# Patient Record
Sex: Male | Born: 1965
Health system: Southern US, Community
[De-identification: ages and names within clinical notes are randomized; demographics above are authoritative.]

## PROBLEM LIST (undated history)

## (undated) DIAGNOSIS — M542 Cervicalgia: Secondary | ICD-10-CM

## (undated) DIAGNOSIS — H9192 Unspecified hearing loss, left ear: Secondary | ICD-10-CM

## (undated) DIAGNOSIS — R030 Elevated blood-pressure reading, without diagnosis of hypertension: Secondary | ICD-10-CM

## (undated) DIAGNOSIS — R55 Syncope and collapse: Secondary | ICD-10-CM

## (undated) DIAGNOSIS — I10 Essential (primary) hypertension: Secondary | ICD-10-CM

## (undated) DIAGNOSIS — M5442 Lumbago with sciatica, left side: Secondary | ICD-10-CM

## (undated) DIAGNOSIS — H6092 Unspecified otitis externa, left ear: Secondary | ICD-10-CM

## (undated) HISTORY — DX: Lumbago with sciatica, left side: M54.42

## (undated) HISTORY — DX: Cervicalgia: M54.2

## (undated) HISTORY — DX: Unspecified otitis externa, left ear: H60.92

## (undated) HISTORY — DX: Unspecified hearing loss, left ear: H91.92

## (undated) HISTORY — PX: CERVICAL SPINE SURGERY: SHX589

## (undated) HISTORY — DX: Elevated blood-pressure reading, without diagnosis of hypertension: R03.0

## (undated) HISTORY — DX: Syncope and collapse: R55

---

## 2014-11-25 ENCOUNTER — Emergency Department
Admission: EM | Admit: 2014-11-25 | Discharge: 2014-11-25 | Disposition: A | Discharge status: Home / Self Care | Payer: Self-pay | Source: Home / Self Care | Attending: Family Medicine | Admitting: Family Medicine

## 2014-11-25 ENCOUNTER — Encounter: Payer: Self-pay | Admitting: *Deleted

## 2014-11-25 DIAGNOSIS — M5410 Radiculopathy, site unspecified: Secondary | ICD-10-CM

## 2014-11-25 DIAGNOSIS — L905 Scar conditions and fibrosis of skin: Secondary | ICD-10-CM

## 2014-11-25 MED ORDER — LIDOCAINE 5 % EX PTCH: MEDICATED_PATCH | CUTANEOUS | Status: DC

## 2014-11-25 MED ORDER — PREDNISONE 20 MG PO TABS: 20.0000 mg | ORAL_TABLET | Freq: Two times a day (BID) | ORAL | Status: DC

## 2014-11-25 NOTE — ED Notes (Signed)
Pt c/o LT upper leg pain x 1 day in the area where he had a wound 4-5 years ago. Denies fever.

## 2014-11-25 NOTE — ED Provider Notes (Signed)
CSN: [id number]     Arrival date & time 11/25/14  1902 History   First MD Initiated Contact with Patient 11/25/14 1922     Chief Complaint  Patient presents with  . Leg Pain      HPI Comments: Patient presents with two complaints: 1)  He has a painful scar on his left hip area as the result of an injury about 4 to 5 years ago.  The scar burns and stings, and exacerbates his left radicular back pain.  No recent erythema or warmth.  He has had good results in past with a lidocaine patch. 2)  He has a history of chronic left low back pain which flares up at times, radiating into his left hip and thigh.   He denies bowel or bladder dysfunction, and no saddle numbness.  He denies recent injury.    Patient is a 49 y.o. male presenting with back pain. The history is provided by the patient.  Back Pain Location:  Lumbar spine Quality:  Aching Radiates to:  L thigh Pain severity:  Mild Pain is:  Same all the time Onset quality:  Gradual Timing:  Constant Progression:  Worsening Chronicity:  Chronic Context: lifting heavy objects   Context: not recent injury   Relieved by:  Nothing Worsened by:  Stress Ineffective treatments:  None tried Associated symptoms: leg pain and weakness   Associated symptoms: no abdominal pain, no bladder incontinence, no bowel incontinence, no chest pain, no dysuria, no fever, no numbness, no paresthesias, no pelvic pain, no perianal numbness, no tingling and no weight loss   Risk factors: lack of exercise and obesity     History reviewed. No pertinent past medical history. Past Surgical History  Procedure Laterality Date  . Cervical spine surgery     Family History  Problem Relation Age of Onset  . Cancer Mother     intestinal  . Cancer Father     unknown type   Social History  Substance Use Topics  . Smoking status: Never Smoker   . Smokeless tobacco: None  . Alcohol Use: No    Review of Systems  Constitutional: Negative for fever and weight  loss.  Cardiovascular: Negative for chest pain.  Gastrointestinal: Negative for abdominal pain and bowel incontinence.  Genitourinary: Negative for bladder incontinence, dysuria and pelvic pain.  Musculoskeletal: Positive for back pain.  Skin: Positive for wound.  Neurological: Positive for weakness. Negative for tingling, numbness and paresthesias.    Allergies  Review of patient's allergies indicates no known allergies.  Home Medications   Prior to Admission medications   Medication Sig Start Date End Date Taking? Authorizing Provider  lidocaine (LIDODERM) 5 % Place 1/2 patch onto affected area at bedtime.  Remove & Discard patch within 12 hours 11/25/14   Lattie Haw, MD  predniSONE (DELTASONE) 20 MG tablet Take 1 tablet (20 mg total) by mouth 2 (two) times daily. Take with food. 11/25/14   Lattie Haw, MD   Meds Ordered and Administered this Visit  Medications - No data to display  BP 143/91 mmHg  Pulse 69  Temp(Src) 99 F (37.2 C) (Oral)  Resp 18  Ht  (1.651 m)  Wt 219 lb (99.338 kg)  BMI 36.44 kg/m2  SpO2 98% No data found.   Physical Exam  Constitutional: He is oriented to person, place, and time. He appears well-developed and well-nourished. No distress.  Patient is obese (BMI 36.4)  HENT:  Head: Normocephalic.  Nose: Nose  normal.  Mouth/Throat: Oropharynx is clear and moist.  Eyes: Conjunctivae are normal. Pupils are equal, round, and reactive to light.  Neck: Normal range of motion.  Cardiovascular: Normal heart sounds.   Pulmonary/Chest: Breath sounds normal.  Abdominal: There is no tenderness.  Musculoskeletal:       Lumbar back: He exhibits decreased range of motion, tenderness and pain. He exhibits no bony tenderness and no swelling.       Back:  Back:  Decreased range of motion. Tenderness in the left lumbosacral paraspinous area.  Straight leg raising test is positive on the left.  Sitting knee extension test is also positive on the left.   Strength and sensation in the lower extremities is intact.  Patellar and achilles reflexes are normal   Neurological: He is alert and oriented to person, place, and time.  Skin: Skin is warm and dry. He is not diaphoretic.     There is a well-healed superficial scar about 7cm diameter left hip area as noted on diagram.  The scar is mildly tender to palpation without erythema, warmth, or induration    Nursing note and vitals reviewed.   ED Course  Procedures  none  MDM   1. Radicular low back pain   2. Scar of hip    Begin prednisone burst.  Rx for lidoderm patch:  Apply 1/2 patch to scar at bedtime and leave on for 12 hours. Continue to apply ice pack to lower back for 20 to 30 minutes, 3 to 4 times daily  Continue until pain decreases.  Avoid heavy lifting. Followup with Dr. Rodney Langton or Dr. Clementeen [facility] ([facility]) in one week.    Lattie Haw, MD 11/27/14 1022

## 2014-11-25 NOTE — Discharge Instructions (Signed)
Continue to apply ice pack to lower back for 20 to 30 minutes, 3 to 4 times daily  Continue until pain decreases.  Avoid heavy lifting.   Lumbosacral Radiculopathy Lumbosacral radiculopathy is a pinched nerve or nerves in the low back (lumbosacral area). When this happens you may have weakness in your legs and may not be able to stand on your toes. You may have pain going down into your legs. There may be difficulties with walking normally. There are many causes of this problem. Sometimes this may happen from an injury, or simply from arthritis or boney problems. It may also be caused by other illnesses such as diabetes. If there is no improvement after treatment, further studies may be done to find the exact cause. DIAGNOSIS  X-rays may be needed if the problems become long standing. Electromyograms may be done. This study is one in which the working of nerves and muscles is studied. HOME CARE INSTRUCTIONS   Applications of ice packs may be helpful. Ice can be used in a plastic bag with a towel around it to prevent frostbite to skin. This may be used every 2 hours for 20 to 30 minutes, or as needed, while awake, or as directed by your caregiver.  Only take over-the-counter or prescription medicines for pain, discomfort, or fever as directed by your caregiver.  If physical therapy was prescribed, follow your caregiver's directions. SEEK IMMEDIATE MEDICAL CARE IF:   You have pain not controlled with medications.  You seem to be getting worse rather than better.  You develop increasing weakness in your legs.  You develop loss of bowel or bladder control.  You have difficulty with walking or balance, or develop clumsiness in the use of your legs.  You have a fever. MAKE SURE YOU:   Understand these instructions.  Will watch your condition.  Will get help right away if you are not doing well or get worse. Document Released: 03/15/2005 Document Revised: 06/07/2011 Document Reviewed:  11/03/2007 [facility] Patient Information 2015 [place], [place]. This information is not intended to replace advice given to you by your health care provider. Make sure you discuss any questions you have with your health care provider.

## 2014-11-27 ENCOUNTER — Telehealth: Payer: Self-pay | Admitting: *Deleted

## 2014-11-27 NOTE — ED Notes (Unsigned)
Trevor Jones came by the clinic states that he is no better, he did not receive an rx from Dr Cathren Harsh and needs another work note to excuse him until 12/03/14. I explained to him that he was given his rx at discharge to take to the pharmacy to fill. He states that he didn't have it, told him I could call it in for him.  I reprinted his original work note since he did not have it either. Advised him per [name] we will not extend his work note.He needs to f/u with PCP/sports med as he was advised by [name] at his previous office visit. He states that he will go to [facility] to get another work note and did not want me to call in the meds from [name] will get new rx from [facility]. Advised him again to call Dr [name] in the morning to establish and f/u from his office visit. Clemens Catholic, LPN

## 2014-12-03 ENCOUNTER — Emergency Department (HOSPITAL_COMMUNITY)
Admission: EM | Admit: 2014-12-03 | Discharge: 2014-12-03 | Disposition: A | Discharge status: Home / Self Care | Payer: Self-pay | Attending: Emergency Medicine | Admitting: Emergency Medicine

## 2014-12-03 ENCOUNTER — Encounter (HOSPITAL_COMMUNITY): Payer: Self-pay | Admitting: Emergency Medicine

## 2014-12-03 ENCOUNTER — Emergency Department (HOSPITAL_COMMUNITY): Payer: Self-pay

## 2014-12-03 DIAGNOSIS — S24109A Unspecified injury at unspecified level of thoracic spinal cord, initial encounter: Secondary | ICD-10-CM | POA: Insufficient documentation

## 2014-12-03 DIAGNOSIS — Y9389 Activity, other specified: Secondary | ICD-10-CM | POA: Insufficient documentation

## 2014-12-03 DIAGNOSIS — W19XXXA Unspecified fall, initial encounter: Secondary | ICD-10-CM

## 2014-12-03 DIAGNOSIS — S199XXA Unspecified injury of neck, initial encounter: Secondary | ICD-10-CM | POA: Insufficient documentation

## 2014-12-03 DIAGNOSIS — Y998 Other external cause status: Secondary | ICD-10-CM | POA: Insufficient documentation

## 2014-12-03 DIAGNOSIS — M5432 Sciatica, left side: Secondary | ICD-10-CM | POA: Insufficient documentation

## 2014-12-03 DIAGNOSIS — W1839XA Other fall on same level, initial encounter: Secondary | ICD-10-CM | POA: Insufficient documentation

## 2014-12-03 DIAGNOSIS — I10 Essential (primary) hypertension: Secondary | ICD-10-CM | POA: Insufficient documentation

## 2014-12-03 DIAGNOSIS — Y9289 Other specified places as the place of occurrence of the external cause: Secondary | ICD-10-CM | POA: Insufficient documentation

## 2014-12-03 HISTORY — DX: Essential (primary) hypertension: I10

## 2014-12-03 LAB — BASIC METABOLIC PANEL
Anion gap: 12 (ref 5–15)
BUN: 14 mg/dL (ref 6–20)
CO2: 23 mmol/L (ref 22–32)
Calcium: 8.9 mg/dL (ref 8.9–10.3)
Chloride: 98 mmol/L — ABNORMAL LOW (ref 101–111)
Creatinine, Ser: 0.87 mg/dL (ref 0.61–1.24)
GFR calc Af Amer: >60 mL/min (ref >60)
GFR calc non Af Amer: >60 mL/min (ref >60)
Glucose, Bld: 108 mg/dL — ABNORMAL HIGH (ref 65–99)
Potassium: 4.3 mmol/L (ref 3.5–5.1)
Sodium: 133 mmol/L — ABNORMAL LOW (ref 135–145)

## 2014-12-03 LAB — URINALYSIS, ROUTINE W REFLEX MICROSCOPIC
Bilirubin Urine: NEGATIVE
Glucose, UA: NEGATIVE mg/dL
Hgb urine dipstick: NEGATIVE
Ketones, ur: NEGATIVE mg/dL
Leukocytes, UA: NEGATIVE
Nitrite: NEGATIVE
Protein, ur: NEGATIVE mg/dL
Specific Gravity, Urine: 1.02 (ref 1.005–1.030)
Urobilinogen, UA: 1 mg/dL (ref 0.0–1.0)
pH: 7 (ref 5.0–8.0)

## 2014-12-03 LAB — CBC
HCT: 44.7 % (ref 39.0–52.0)
Hemoglobin: 15.3 g/dL (ref 13.0–17.0)
MCH: 33 pg (ref 26.0–34.0)
MCHC: 34.2 g/dL (ref 30.0–36.0)
MCV: 96.3 fL (ref 78.0–100.0)
Platelets: 130 10*3/uL — ABNORMAL LOW (ref 150–400)
RBC: 4.64 MIL/uL (ref 4.22–5.81)
RDW: 13.1 % (ref 11.5–15.5)
WBC: 6.3 10*3/uL (ref 4.0–10.5)

## 2014-12-03 LAB — TROPONIN I: Troponin I: <0.03 ng/mL (ref <0.031)

## 2014-12-03 LAB — CBG MONITORING, ED: Glucose-Capillary: 101 mg/dL — ABNORMAL HIGH (ref 65–99)

## 2014-12-03 MED ORDER — CYCLOBENZAPRINE HCL 10 MG PO TABS
5.0000 mg | ORAL_TABLET | Freq: Once | ORAL | Status: AC
  Administered 2014-12-03: 5 mg via ORAL
  Filled 2014-12-03: qty 1

## 2014-12-03 MED ORDER — KETOROLAC TROMETHAMINE 30 MG/ML IJ SOLN
30.0000 mg | Freq: Once | INTRAMUSCULAR | Status: AC
  Administered 2014-12-03: 30 mg via INTRAVENOUS
  Filled 2014-12-03: qty 1

## 2014-12-03 NOTE — ED Notes (Addendum)
Pt was found on the ground by his wife after falling-was on the way to drop his wife off for work, walked around the front of the car and fell but caught himself with his arms so he did not hit his head (per patient). A&Ox4 when found. Had been off work recently for back and shoulder pain-diagnosed with pinched nerve. Was supposed to go back to work today. Having some neck pain and now having left buttock and left lower back pain. Has had "equilibrium problems" in the past. Endorses dizziness before falling and says, "I just didn't feel good this morning." Speaking full/clear sentences. RR even/unlabored. C-collar applied and aligned. Placed on LSB. Able to answer all questions appropriately. Has not eaten anything since last night. No hx DM. No other c/c. No abrasions or lacerations noted anywhere on body.

## 2014-12-03 NOTE — Discharge Instructions (Signed)
Take the medication give to you last week. Back Pain, Adult Back pain is very common. The pain often gets better over time. The cause of back pain is usually not dangerous. Most people can learn to manage their back pain on their own.  HOME CARE   Stay active. Start with short walks on flat ground if you can. Try to walk farther each day.  Do not sit, drive, or stand in one place for more than 30 minutes. Do not stay in bed.  Do not avoid exercise or work. Activity can help your back heal faster.  Be careful when you bend or lift an object. Bend at your knees, keep the object close to you, and do not twist.  Sleep on a firm mattress. Lie on your side, and bend your knees. If you lie on your back, put a pillow under your knees.  Only take medicines as told by your doctor.  Put ice on the injured area.  Put ice in a plastic bag.  Place a towel between your skin and the bag.  Leave the ice on for 15-20 minutes, 03-04 times a day for the first 2 to 3 days. After that, you can switch between ice and heat packs.  Ask your doctor about back exercises or massage.  Avoid feeling anxious or stressed. Find good ways to deal with stress, such as exercise. GET HELP RIGHT AWAY IF:   Your pain does not go away with rest or medicine.  Your pain does not go away in 1 week.  You have new problems.  You do not feel well.  The pain spreads into your legs.  You cannot control when you poop (bowel movement) or pee (urinate).  Your arms or legs feel weak or lose feeling (numbness).  You feel sick to your stomach (nauseous) or throw up (vomit).  You have belly (abdominal) pain.  You feel like you may pass out (faint). MAKE SURE YOU:   Understand these instructions.  Will watch your condition.  Will get help right away if you are not doing well or get worse. Document Released: 09/01/2007 Document Revised: 06/07/2011 Document Reviewed: 07/17/2013 [facility] Patient Information 2015  [place], [place]. This information is not intended to replace advice given to you by your health care provider. Make sure you discuss any questions you have with your health care provider.

## 2014-12-03 NOTE — ED Provider Notes (Signed)
CSN: [id number]     Arrival date & time 12/03/14  0608 History   First MD Initiated Contact with Patient 12/03/14 [phone]     Chief Complaint  Patient presents with  . Fall  . Fatigue  . Back Pain     (Consider location/radiation/quality/duration/timing/severity/associated sxs/prior Treatment) HPI Comments: Pt comes in with c/o ongoing left lower back pain that radiates down he left leg. Denies numbness or weakness. States that he was seen for it last week at urgent care and was given medications but he hasn't taken any because he can't  Afford them. He states that he wasn't feeling very good this morning and when he was bringing his wife to work this morning he fell. He states that he is doesn't think that he had loc and he states that he knows he didn't hit his head. He states that he has a history of equilibrium problems like this in the past. He denies cp or sob. He states that he is having neck pain and upper back pain since falling.  The history is provided by the patient and the spouse. No language interpreter was used.    Past Medical History  Diagnosis Date  . Hypertension    Past Surgical History  Procedure Laterality Date  . Cervical spine surgery     Family History  Problem Relation Age of Onset  . Cancer Mother     intestinal  . Cancer Father     unknown type   Social History  Substance Use Topics  . Smoking status: Never Smoker   . Smokeless tobacco: None  . Alcohol Use: No    Review of Systems  All other systems reviewed and are negative.     Allergies  Review of patient's allergies indicates no known allergies.  Home Medications   Prior to Admission medications   Medication Sig Start Date End Date Taking? Authorizing Provider  ibuprofen (ADVIL,MOTRIN) 200 MG tablet Take 800 mg by mouth every 6 (six) hours as needed for moderate pain.   Yes Historical Provider, MD  lidocaine (LIDODERM) 5 % Place 1/2 patch onto affected area at bedtime.  Remove & Discard  patch within 12 hours Patient not taking: Reported on 12/03/2014 11/25/14   Lattie Haw, MD  predniSONE (DELTASONE) 20 MG tablet Take 1 tablet (20 mg total) by mouth 2 (two) times daily. Take with food. Patient not taking: Reported on 12/03/2014 11/25/14   Lattie Haw, MD   BP 118/85 mmHg  Pulse 63  Temp(Src) 98.3 F (36.8 C) (Oral)  Resp 13  Ht  (1.651 m)  Wt 217 lb (98.431 kg)  BMI 36.11 kg/m2  SpO2 99% Physical Exam  Constitutional: He is oriented to person, place, and time. He appears well-developed and well-nourished.  HENT:  Head: Normocephalic and atraumatic.  Right Ear: External ear normal.  Cardiovascular: Normal rate and regular rhythm.   Pulmonary/Chest: Effort normal.  Abdominal: Soft. Bowel sounds are normal. There is no tenderness.  Musculoskeletal:       Cervical back: He exhibits bony tenderness.       Thoracic back: He exhibits tenderness.       Lumbar back: He exhibits no bony tenderness.  Left sciatic notch tenderness. Full rom of motion and sensation to all extremities. No sign of abrasion or laceration  Neurological: He is alert and oriented to person, place, and time.  Skin: Skin is warm and dry.  Psychiatric: He has a normal mood and affect.  Nursing  note and vitals reviewed.   ED Course  Procedures (including critical care time) Labs Review Labs Reviewed  BASIC METABOLIC PANEL - Abnormal; Notable for the following:    Sodium 133 (*)    Chloride 98 (*)    Glucose, Bld 108 (*)    All other components within normal limits  CBC - Abnormal; Notable for the following:    Platelets 130 (*)    All other components within normal limits  CBG MONITORING, ED - Abnormal; Notable for the following:    Glucose-Capillary 101 (*)    All other components within normal limits  URINALYSIS, ROUTINE W REFLEX MICROSCOPIC (NOT AT [facility])  TROPONIN I  CBG MONITORING, ED    Imaging Review Dg Cervical Spine Complete  12/03/2014   CLINICAL DATA:  Pain following  fall earlier today. Numbness in fingers of left hand.  EXAM: CERVICAL SPINE  4+ VIEWS  COMPARISON:  None.  FINDINGS: Frontal, lateral, open-mouth odontoid, and bilateral oblique views were obtained with the patient in collar. The patient is status post anterior screw and plate fixation at C3 and C4 with the screw and plate fixation device appearing intact. There is a disc spacer at C3-4, intact. There is no demonstrable fracture or spondylolisthesis. Prevertebral soft tissues and predental space regions are within normal limits. There is fairly marked disc space narrowing at C5-6. There is moderate narrowing at C2-3 and C6-7. There is exit foraminal narrowing due to bony hypertrophy at C5-6 bilaterally and at C6-7 bilaterally, more severe on the left than on the right.  IMPRESSION: Postoperative change and multilevel osteoarthritic change. No fracture or spondylolisthesis appreciable. Note that no assessment for potential ligamentous injury can be made with in collar only images.   Electronically Signed   By: Bretta Bang III M.D.   On: 12/03/2014 07:46   Dg Thoracic Spine W/swimmers  12/03/2014   CLINICAL DATA:  Fall this morning with mid upper back pain. Initial encounter.  EXAM: THORACIC SPINE - 3 VIEWS  COMPARISON:  None.  FINDINGS: Subtle T2 and T3 superior endplate concavities in the lateral projection is attributed to obliquity. There is no definitive fracture. No subluxation. Spondylotic spurring noted at the thoracolumbar junction.  IMPRESSION: No subluxation or convincing fracture.   Electronically Signed   By: Marnee Spring M.D.   On: 12/03/2014 07:51   I have personally reviewed and evaluated these images and lab results as part of my medical decision-making.   EKG Interpretation   Date/Time:  Tuesday December 03 2014 06:12:42 EDT Ventricular Rate:  66 PR Interval:  156 QRS Duration: 76 QT Interval:  383 QTC Calculation: 401 R Axis:   83 Text Interpretation:  Sinus rhythm Probable  left atrial enlargement ST  elev, probable normal early repol pattern no reciprocal changes No old  tracing to compare Confirmed by GOLDSTON  MD, SCOTT ([id number]) on 12/03/2014  6:59:37 AM      MDM   Final diagnoses:  Fall, initial encounter  Sciatica, left    Doubt that pt had a syncopal episode. Think more likely a fall. Pt was given medications for sciatica last week that weren't filled.discussed with pt that he needs to have filled. Pt states that he needs a couple of weeks off work. Discussed with pt that we don't give that much time off from the er.    Teressa Lower, NP 12/03/14 1610  Pricilla Loveless, MD 12/04/14 [phone]

## 2014-12-16 ENCOUNTER — Encounter: Payer: Self-pay | Admitting: Family

## 2014-12-16 ENCOUNTER — Ambulatory Visit (INDEPENDENT_AMBULATORY_CARE_PROVIDER_SITE_OTHER): Payer: Self-pay | Admitting: Family

## 2014-12-16 VITALS — BP 138/88 | HR 78 | Temp 98.5°F | Resp 18 | Ht 65.0 in | Wt 214.0 lb

## 2014-12-16 DIAGNOSIS — M542 Cervicalgia: Secondary | ICD-10-CM

## 2014-12-16 DIAGNOSIS — R55 Syncope and collapse: Secondary | ICD-10-CM

## 2014-12-16 DIAGNOSIS — M5442 Lumbago with sciatica, left side: Secondary | ICD-10-CM

## 2014-12-16 HISTORY — DX: Lumbago with sciatica, left side: M54.42

## 2014-12-16 HISTORY — DX: Cervicalgia: M54.2

## 2014-12-16 HISTORY — DX: Syncope and collapse: R55

## 2014-12-16 MED ORDER — PREDNISONE 10 MG PO TABS: ORAL_TABLET | ORAL | Status: DC

## 2014-12-16 NOTE — Progress Notes (Signed)
Subjective:    Patient ID: Trevor Jones, male    DOB: [birth year], 49 y.o.   MRN: [id number]  Chief Complaint  Patient presents with  . Establish Care    States that he is having issues with pain in his left hip and down leg, also in arms and did have neck surgery a while back, has had issues with blacking out and has jury duty this week and wants to know if he can get a note to get out of it due to the passing out.     HPI:  Trevor Jones is a 49 y.o. male who  has a past medical history of Hypertension; Neck pain; and Decreased hearing of left ear. and presents today for an office visit to establish care.   1.) Back pain / hip pain - Associated symptom of pain located in his lower back and left hip that has been going on for about 1 year. Pain is described as throbbing with a severity of 8/10. Modifying treatments includes heat and ice which have helped with his pain. Previously seen in Urgent Care for similar concerns and was diagnosed with radicular low back pain and discharged with prednisone which he did not take because he indicates that he cannot afford it and there was concern it may make him drowsy. He is also requesting a note excusing him from upcoming jury duty.   2.) Blacking out - Associated symptoms of blacking out has been going on for a couple of years since his neck surgery. Unsure of frequency, most recently was seen in the ED about 2 weeks ago believing he blacked out which resulted in a fall, however per ED notes there is low belief for any syncope episode. Denies any workup for the black outs.    No Known Allergies   Outpatient Prescriptions Prior to Visit  Medication Sig Dispense Refill  . ibuprofen (ADVIL,MOTRIN) 200 MG tablet Take 800 mg by mouth every 6 (six) hours as needed for moderate pain.    Marland Kitchen lidocaine (LIDODERM) 5 % Place 1/2 patch onto affected area at bedtime.  Remove & Discard patch within 12 hours 6 patch 0  . predniSONE (DELTASONE) 20 MG  tablet Take 1 tablet (20 mg total) by mouth 2 (two) times daily. Take with food. 10 tablet 0   No facility-administered medications prior to visit.     Past Medical History  Diagnosis Date  . Hypertension   . Neck pain   . Decreased hearing of left ear      Past Surgical History  Procedure Laterality Date  . Cervical spine surgery       Family History  Problem Relation Age of Onset  . Cancer Mother     intestinal  . Cancer Father     unknown type     Social History   Social History  . Marital Status: Married    Spouse Name: N/A  . Number of Children: 0  . Years of Education: 14   Occupational History  . Scientist, physiological     Social History Main Topics  . Smoking status: Never Smoker   . Smokeless tobacco: Never Used  . Alcohol Use: No  . Drug Use: No  . Sexual Activity: Not on file   Other Topics Concern  . Not on file   Social History Narrative   Denies religious beliefs effecting health care.     Review of Systems  Constitutional: Negative for fever and chills.  Respiratory:  Negative for chest tightness and shortness of breath.   Cardiovascular: Negative for chest pain, palpitations and leg swelling.  Musculoskeletal: Positive for back pain, neck pain and neck stiffness.  Neurological: Positive for syncope.      Objective:    BP 138/88 mmHg  Pulse 78  Temp(Src) 98.5 F (36.9 C) (Oral)  Resp 18  Ht  (1.651 m)  Wt 214 lb (97.07 kg)  BMI 35.61 kg/m2  SpO2 97% Nursing note and vital signs reviewed.  Physical Exam  Constitutional: He is oriented to person, place, and time. He appears well-developed and well-nourished. No distress.  Neck:  No obvious deformity, discoloration, or edema of the neck noted. Tenderness noted along paraspinal musculature with no evident muscle spasm. Range of motion restricted in lateral bending bilaterally. Compression test is negative. Distal pulses, sensation, and reflexes are intact and appropriate.   Cardiovascular: Normal rate, regular rhythm, normal heart sounds and intact distal pulses.   Pulmonary/Chest: Effort normal and breath sounds normal.  Musculoskeletal:  Low back with no obvious deformity, discoloration, or edema. There is excessive lordotic curve secondary to obesity. Palpable tenderness of lumbar spine paraspinal musculature on the left and sciatic nerve distribution. Range of motion is limited secondary to tight musculature and discomfort. Distal pulses and sensation are intact and appropriate.  Neurological: He is alert and oriented to person, place, and time.  Skin: Skin is warm and dry.  Psychiatric: He has a normal mood and affect. His behavior is normal. Judgment and thought content normal.       Assessment & Plan:   Problem List Items Addressed This Visit      Cardiovascular and Mediastinum   Black-out (not amnesia)    Question episodes of blackout/syncope as described by patient. Events occur infrequently noting 4 times in the past 2 years. Previous EKG with mild ST elevation in the emergency room and normal sinus rhythm. Obtain 2-D echocardiogram. Follow up pending blood work.      Relevant Orders   Echocardiogram     Other   Cervical pain - Primary    Cervical pain most likely related to previous surgery and restricted range of motion. Continue conservative treatment with home exercise therapy, heat, and stretching. Refer to formalized physical therapy. May require additional imaging if no improvement is noted.      Relevant Medications   predniSONE (DELTASONE) 10 MG tablet   Other Relevant Orders   Ambulatory referral to Physical Therapy   AMB referral to orthopedics   Left-sided low back pain with left-sided sciatica    Left-sided lower back pain consistent with possible disc pathology and/or sciatic nerve root irritation. Start prednisone. Discussed risks and benefits of medication with patient and encouraged patient compliance. Continue home exercise  therapy with heat and ice. Refer to physical therapy for further evaluation and treatment. May require additional imaging and possible referral to orthopedics for injections. Continue work as tolerated.      Relevant Medications   predniSONE (DELTASONE) 10 MG tablet   Other Relevant Orders   Ambulatory referral to Physical Therapy   AMB referral to orthopedics

## 2014-12-16 NOTE — Progress Notes (Signed)
Pre visit review using our clinic review tool, if applicable. No additional management support is needed unless otherwise documented below in the visit note. 

## 2014-12-16 NOTE — Patient Instructions (Signed)
Thank you for choosing [facility].  Summary/Instructions:  6 Day Prednisone Taper Instructions:   Day 1: Two tablets before breakfast, one after lunch, one after dinner, and two at bedtime.  Day 2: One tablet before breakfast, one after lunch, one after dinner, and two at bedtime Day 3: One tablet before breakfast, one after lunch, one after dinner, and one at bedtime Day 4: One tablet before breakfast, one after lunch, and one at bedtime Day 5: One tablet before breakfast and one at bedtime Day 6: One tablet before breakfast   Your prescription(s) have been submitted to your pharmacy or been printed and provided for you. Please take as directed and contact our office if you believe you are having problem(s) with the medication(s) or have any questions.  Referrals have been made during this visit. You should expect to hear back from our schedulers in about 7-10 days in regards to establishing an appointment with the specialists we discussed.   If your symptoms worsen or fail to improve, please contact our office for further instruction, or in case of emergency go directly to the emergency room at the closest medical facility.   The goal of pain medication is to ease the pain, but it will not get rid of it completely. Do not take more pain medicine than needed.   Do NOT drive while taking narcotic pain medications.   Do NOT drink alcohol while taking prescription pain medication.   Please use over-the-counter medications for constipation, such as miralax, senakot, colace, or magnesium citrate, while you are taking narcotic pain medication.  Pay attention to the side effects of any pain medication  Do NOT exceed 3000 mg of acetaminophen (Tylenol) TOTAL in a 24 hour period. (Please be aware that you may have acetaminophen in other medications that you are taking.)   EXERCISES RANGE OF MOTION (ROM) AND STRETCHING EXERCISES - Cervical Strain and Sprain These exercises may help  you when beginning to rehabilitate your injury. In order to successfully resolve your symptoms, you must improve your posture. These exercises are designed to help reduce the forward-head and rounded-shoulder posture which contributes to this condition. Your symptoms may resolve with or without further involvement from your physician, physical therapist or athletic trainer. While completing these exercises, remember:   Restoring tissue flexibility helps normal motion to return to the joints. This allows healthier, less painful movement and activity.  An effective stretch should be held for at least 20 seconds, although you may need to begin with shorter hold times for comfort.  A stretch should never be painful. You should only feel a gentle lengthening or release in the stretched tissue. STRETCH- Axial Extensors  Lie on your back on the floor. You may bend your knees for comfort. Place a rolled-up hand towel or dish towel, about 2 inches in diameter, under the part of your head that makes contact with the floor.  Gently tuck your chin, as if trying to make a "double chin," until you feel a gentle stretch at the base of your head.  Hold __________ seconds. Repeat __________ times. Complete this exercise __________ times per day.  STRETCH - Axial Extension   Stand or sit on a firm surface. Assume a good posture: chest up, shoulders drawn back, abdominal muscles slightly tense, knees unlocked (if standing) and feet hip width apart.  Slowly retract your chin so your head slides back and your chin slightly lowers. Continue to look straight ahead.  You should feel a gentle stretch  in the back of your head. Be certain not to feel an aggressive stretch since this can cause headaches later.  Hold for __________ seconds. Repeat __________ times. Complete this exercise __________ times per day. STRETCH - Cervical Side Bend   Stand or sit on a firm surface. Assume a good posture: chest up, shoulders  drawn back, abdominal muscles slightly tense, knees unlocked (if standing) and feet hip width apart.  Without letting your nose or shoulders move, slowly tip your right / left ear to your shoulder until your feel a gentle stretch in the muscles on the opposite side of your neck.  Hold __________ seconds. Repeat __________ times. Complete this exercise __________ times per day. STRETCH - Cervical Rotators   Stand or sit on a firm surface. Assume a good posture: chest up, shoulders drawn back, abdominal muscles slightly tense, knees unlocked (if standing) and feet hip width apart.  Keeping your eyes level with the ground, slowly turn your head until you feel a gentle stretch along the back and opposite side of your neck.  Hold __________ seconds. Repeat __________ times. Complete this exercise __________ times per day. RANGE OF MOTION - Neck Circles   Stand or sit on a firm surface. Assume a good posture: chest up, shoulders drawn back, abdominal muscles slightly tense, knees unlocked (if standing) and feet hip width apart.  Gently roll your head down and around from the back of one shoulder to the back of the other. The motion should never be forced or painful.  Repeat the motion 10-20 times, or until you feel the neck muscles relax and loosen. Repeat __________ times. Complete the exercise __________ times per day. STRENGTHENING EXERCISES - Cervical Strain and Sprain These exercises may help you when beginning to rehabilitate your injury. They may resolve your symptoms with or without further involvement from your physician, physical therapist, or athletic trainer. While completing these exercises, remember:   Muscles can gain both the endurance and the strength needed for everyday activities through controlled exercises.  Complete these exercises as instructed by your physician, physical therapist, or athletic trainer. Progress the resistance and repetitions only as guided.  You may  experience muscle soreness or fatigue, but the pain or discomfort you are trying to eliminate should never worsen during these exercises. If this pain does worsen, stop and make certain you are following the directions exactly. If the pain is still present after adjustments, discontinue the exercise until you can discuss the trouble with your clinician. STRENGTH - Cervical Flexors, Isometric  Face a wall, standing about 6 inches away. Place a small pillow, a ball about 6-8 inches in diameter, or a folded towel between your forehead and the wall.  Slightly tuck your chin and gently push your forehead into the soft object. Push only with mild to moderate intensity, building up tension gradually. Keep your jaw and forehead relaxed.  Hold 10 to 20 seconds. Keep your breathing relaxed.  Release the tension slowly. Relax your neck muscles completely before you start the next repetition. Repeat __________ times. Complete this exercise __________ times per day. STRENGTH- Cervical Lateral Flexors, Isometric   Stand about 6 inches away from a wall. Place a small pillow, a ball about 6-8 inches in diameter, or a folded towel between the side of your head and the wall.  Slightly tuck your chin and gently tilt your head into the soft object. Push only with mild to moderate intensity, building up tension gradually. Keep your jaw and forehead relaxed.  Hold 10 to 20 seconds. Keep your breathing relaxed.  Release the tension slowly. Relax your neck muscles completely before you start the next repetition. Repeat __________ times. Complete this exercise __________ times per day. STRENGTH - Cervical Extensors, Isometric   Stand about 6 inches away from a wall. Place a small pillow, a ball about 6-8 inches in diameter, or a folded towel between the back of your head and the wall.  Slightly tuck your chin and gently tilt your head back into the soft object. Push only with mild to moderate intensity, building up  tension gradually. Keep your jaw and forehead relaxed.  Hold 10 to 20 seconds. Keep your breathing relaxed.  Release the tension slowly. Relax your neck muscles completely before you start the next repetition. Repeat __________ times. Complete this exercise __________ times per day. POSTURE AND BODY MECHANICS CONSIDERATIONS - Cervical Strain and Sprain Keeping correct posture when sitting, standing or completing your activities will reduce the stress put on different body tissues, allowing injured tissues a chance to heal and limiting painful experiences. The following are general guidelines for improved posture. Your physician or physical therapist will provide you with any instructions specific to your needs. While reading these guidelines, remember:  The exercises prescribed by your provider will help you have the flexibility and strength to maintain correct postures.  The correct posture provides the optimal environment for your joints to work. All of your joints have less wear and tear when properly supported by a spine with good posture. This means you will experience a healthier, less painful body.  Correct posture must be practiced with all of your activities, especially prolonged sitting and standing. Correct posture is as important when doing repetitive low-stress activities (typing) as it is when doing a single heavy-load activity (lifting). PROLONGED STANDING WHILE SLIGHTLY LEANING FORWARD When completing a task that requires you to lean forward while standing in one place for a long time, place either foot up on a stationary 2- to 4-inch high object to help maintain the best posture. When both feet are on the ground, the low back tends to lose its slight inward curve. If this curve flattens (or becomes too large), then the back and your other joints will experience too much stress, fatigue more quickly, and can cause pain.  RESTING POSITIONS Consider which positions are most painful for  you when choosing a resting position. If you have pain with flexion-based activities (sitting, bending, stooping, squatting), choose a position that allows you to rest in a less flexed posture. You would want to avoid curling into a fetal position on your side. If your pain worsens with extension-based activities (prolonged standing, working overhead), avoid resting in an extended position such as sleeping on your stomach. Most people will find more comfort when they rest with their spine in a more neutral position, neither too rounded nor too arched. Lying on a non-sagging bed on your side with a pillow between your knees, or on your back with a pillow under your knees will often provide some relief. Keep in mind, being in any one position for a prolonged period of time, no matter how correct your posture, can still lead to stiffness. WALKING Walk with an upright posture. Your ears, shoulders, and hips should all line up. OFFICE WORK When working at a desk, create an environment that supports good, upright posture. Without extra support, muscles fatigue and lead to excessive strain on joints and other tissues. CHAIR:  A chair should be  able to slide under your desk when your back makes contact with the back of the chair. This allows you to work closely.  The chair's height should allow your eyes to be level with the upper part of your monitor and your hands to be slightly lower than your elbows.  Body position:  Your feet should make contact with the floor. If this is not possible, use a foot rest.  Keep your ears over your shoulders. This will reduce stress on your neck and low back. Document Released: 03/15/2005 Document Revised: 07/30/2013 Document Reviewed: 06/27/2008 [facility] Patient Information 2015 [place], [place]. This information is not intended to replace advice given to you by your health care provider. Make sure you discuss any questions you have with your health care provider.  HEAT  AND COLD  Cold treatment (icing) should be applied for 10 to 15 minutes every 2 to 3 hours for inflammation and pain, and immediately after activity that aggravates your symptoms. Use ice packs or an ice massage.  Heat treatment may be used before performing stretching and strengthening activities prescribed by your caregiver, physical therapist, or athletic trainer. Use a heat pack or a warm water soak. SEEK MEDICAL CARE IF:   Symptoms get worse or do not improve in 2 to 4 weeks, despite treatment.  You develop numbness or weakness in either leg.  You lose bowel or bladder function.  Any of the following occur after surgery: fever, increased pain, swelling, redness, drainage of fluids, or bleeding in the affected area.  New, unexplained symptoms develop. (Drugs used in treatment may produce side effects.) EXERCISES  RANGE OF MOTION (ROM) AND STRETCHING EXERCISES - Low Back Sprain Most people with lower back pain will find that their symptoms get worse with excessive bending forward (flexion) or arching at the lower back (extension). The exercises that will help resolve your symptoms will focus on the opposite motion.  Your physician, physical therapist or athletic trainer will help you determine which exercises will be most helpful to resolve your lower back pain. Do not complete any exercises without first consulting with your caregiver. Discontinue any exercises which make your symptoms worse, until you speak to your caregiver. If you have pain, numbness or tingling which travels down into your buttocks, leg or foot, the goal of the therapy is for these symptoms to move closer to your back and eventually resolve. Sometimes, these leg symptoms will get better, but your lower back pain may worsen. This is often an indication of progress in your rehabilitation. Be very alert to any changes in your symptoms and the activities in which you participated in the 24 hours prior to the change. Sharing  this information with your caregiver will allow him or her to most efficiently treat your condition. These exercises may help you when beginning to rehabilitate your injury. Your symptoms may resolve with or without further involvement from your physician, physical therapist or athletic trainer. While completing these exercises, remember:   Restoring tissue flexibility helps normal motion to return to the joints. This allows healthier, less painful movement and activity.  An effective stretch should be held for at least 30 seconds.  A stretch should never be painful. You should only feel a gentle lengthening or release in the stretched tissue. FLEXION RANGE OF MOTION AND STRETCHING EXERCISES: STRETCH - Flexion, Single Knee to Chest   Lie on a firm bed or floor with both legs extended in front of you.  Keeping one leg in contact with  the floor, bring your opposite knee to your chest. Hold your leg in place by either grabbing behind your thigh or at your knee.  Pull until you feel a gentle stretch in your low back. Hold __________ seconds.  Slowly release your grasp and repeat the exercise with the opposite side. Repeat __________ times. Complete this exercise __________ times per day.  STRETCH - Flexion, Double Knee to Chest  Lie on a firm bed or floor with both legs extended in front of you.  Keeping one leg in contact with the floor, bring your opposite knee to your chest.  Tense your stomach muscles to support your back and then lift your other knee to your chest. Hold your legs in place by either grabbing behind your thighs or at your knees.  Pull both knees toward your chest until you feel a gentle stretch in your low back. Hold __________ seconds.  Tense your stomach muscles and slowly return one leg at a time to the floor. Repeat __________ times. Complete this exercise __________ times per day.  STRETCH - Low Trunk Rotation  Lie on a firm bed or floor. Keeping your legs in  front of you, bend your knees so they are both pointed toward the ceiling and your feet are flat on the floor.  Extend your arms out to the side. This will stabilize your upper body by keeping your shoulders in contact with the floor.  Gently and slowly drop both knees together to one side until you feel a gentle stretch in your low back. Hold for __________ seconds.  Tense your stomach muscles to support your lower back as you bring your knees back to the starting position. Repeat the exercise to the other side. Repeat __________ times. Complete this exercise __________ times per day  EXTENSION RANGE OF MOTION AND FLEXIBILITY EXERCISES: STRETCH - Extension, Prone on Elbows   Lie on your stomach on the floor, a bed will be too soft. Place your palms about shoulder width apart and at the height of your head.  Place your elbows under your shoulders. If this is too painful, stack pillows under your chest.  Allow your body to relax so that your hips drop lower and make contact more completely with the floor.  Hold this position for __________ seconds.  Slowly return to lying flat on the floor. Repeat __________ times. Complete this exercise __________ times per day.  RANGE OF MOTION - Extension, Prone Press Ups  Lie on your stomach on the floor, a bed will be too soft. Place your palms about shoulder width apart and at the height of your head.  Keeping your back as relaxed as possible, slowly straighten your elbows while keeping your hips on the floor. You may adjust the placement of your hands to maximize your comfort. As you gain motion, your hands will come more underneath your shoulders.  Hold this position __________ seconds.  Slowly return to lying flat on the floor. Repeat __________ times. Complete this exercise __________ times per day.  RANGE OF MOTION- Quadruped, Neutral Spine   Assume a hands and knees position on a firm surface. Keep your hands under your shoulders and your  knees under your hips. You may place padding under your knees for comfort.  Drop your head and point your tailbone toward the ground below you. This will round out your lower back like an angry cat. Hold this position for __________ seconds.  Slowly lift your head and release your tail bone so  that your back sags into a large arch, like an old horse.  Hold this position for __________ seconds.  Repeat this until you feel limber in your low back.  Now, find your "sweet spot." This will be the most comfortable position somewhere between the two previous positions. This is your neutral spine. Once you have found this position, tense your stomach muscles to support your low back.  Hold this position for __________ seconds. Repeat __________ times. Complete this exercise __________ times per day.  STRENGTHENING EXERCISES - Low Back Sprain These exercises may help you when beginning to rehabilitate your injury. These exercises should be done near your "sweet spot." This is the neutral, low-back arch, somewhere between fully rounded and fully arched, that is your least painful position. When performed in this safe range of motion, these exercises can be used for people who have either a flexion or extension based injury. These exercises may resolve your symptoms with or without further involvement from your physician, physical therapist or athletic trainer. While completing these exercises, remember:   Muscles can gain both the endurance and the strength needed for everyday activities through controlled exercises.  Complete these exercises as instructed by your physician, physical therapist or athletic trainer. Increase the resistance and repetitions only as guided.  You may experience muscle soreness or fatigue, but the pain or discomfort you are trying to eliminate should never worsen during these exercises. If this pain does worsen, stop and make certain you are following the directions exactly. If  the pain is still present after adjustments, discontinue the exercise until you can discuss the trouble with your caregiver. STRENGTHENING - Deep Abdominals, Pelvic Tilt   Lie on a firm bed or floor. Keeping your legs in front of you, bend your knees so they are both pointed toward the ceiling and your feet are flat on the floor.  Tense your lower abdominal muscles to press your low back into the floor. This motion will rotate your pelvis so that your tail bone is scooping upwards rather than pointing at your feet or into the floor. With a gentle tension and even breathing, hold this position for __________ seconds. Repeat __________ times. Complete this exercise __________ times per day.  STRENGTHENING - Abdominals, Crunches   Lie on a firm bed or floor. Keeping your legs in front of you, bend your knees so they are both pointed toward the ceiling and your feet are flat on the floor. Cross your arms over your chest.  Slightly tip your chin down without bending your neck.  Tense your abdominals and slowly lift your trunk high enough to just clear your shoulder blades. Lifting higher can put excessive stress on the lower back and does not further strengthen your abdominal muscles.  Control your return to the starting position. Repeat __________ times. Complete this exercise __________ times per day.  STRENGTHENING - Quadruped, Opposite UE/LE Lift   Assume a hands and knees position on a firm surface. Keep your hands under your shoulders and your knees under your hips. You may place padding under your knees for comfort.  Find your neutral spine and gently tense your abdominal muscles so that you can maintain this position. Your shoulders and hips should form a rectangle that is parallel with the floor and is not twisted.  Keeping your trunk steady, lift your right hand no higher than your shoulder and then your left leg no higher than your hip. Make sure you are not holding your breath. Hold  this position for __________ seconds.  Continuing to keep your abdominal muscles tense and your back steady, slowly return to your starting position. Repeat with the opposite arm and leg. Repeat __________ times. Complete this exercise __________ times per day.  STRENGTHENING - Abdominals and Quadriceps, Straight Leg Raise   Lie on a firm bed or floor with both legs extended in front of you.  Keeping one leg in contact with the floor, bend the other knee so that your foot can rest flat on the floor.  Find your neutral spine, and tense your abdominal muscles to maintain your spinal position throughout the exercise.  Slowly lift your straight leg off the floor about 6 inches for a count of 15, making sure to not hold your breath.  Still keeping your neutral spine, slowly lower your leg all the way to the floor. Repeat this exercise with each leg __________ times. Complete this exercise __________ times per day. POSTURE AND BODY MECHANICS CONSIDERATIONS - Low Back Sprain Keeping correct posture when sitting, standing or completing your activities will reduce the stress put on different body tissues, allowing injured tissues a chance to heal and limiting painful experiences. The following are general guidelines for improved posture. Your physician or physical therapist will provide you with any instructions specific to your needs. While reading these guidelines, remember:  The exercises prescribed by your provider will help you have the flexibility and strength to maintain correct postures.  The correct posture provides the best environment for your joints to work. All of your joints have less wear and tear when properly supported by a spine with good posture. This means you will experience a healthier, less painful body.  Correct posture must be practiced with all of your activities, especially prolonged sitting and standing. Correct posture is as important when doing repetitive low-stress  activities (typing) as it is when doing a single heavy-load activity (lifting). RESTING POSITIONS Consider which positions are most painful for you when choosing a resting position. If you have pain with flexion-based activities (sitting, bending, stooping, squatting), choose a position that allows you to rest in a less flexed posture. You would want to avoid curling into a fetal position on your side. If your pain worsens with extension-based activities (prolonged standing, working overhead), avoid resting in an extended position such as sleeping on your stomach. Most people will find more comfort when they rest with their spine in a more neutral position, neither too rounded nor too arched. Lying on a non-sagging bed on your side with a pillow between your knees, or on your back with a pillow under your knees will often provide some relief. Keep in mind, being in any one position for a prolonged period of time, no matter how correct your posture, can still lead to stiffness. PROPER SITTING POSTURE In order to minimize stress and discomfort on your spine, you must sit with correct posture. Sitting with good posture should be effortless for a healthy body. Returning to good posture is a gradual process. Many people can work toward this most comfortably by using various supports until they have the flexibility and strength to maintain this posture on their own. When sitting with proper posture, your ears will fall over your shoulders and your shoulders will fall over your hips. You should use the back of the chair to support your upper back. Your lower back will be in a neutral position, just slightly arched. You may place a small pillow or folded towel at the base  of your lower back for  support.  When working at a desk, create an environment that supports good, upright posture. Without extra support, muscles tire, which leads to excessive strain on joints and other tissues. Keep these recommendations in  mind: CHAIR:  A chair should be able to slide under your desk when your back makes contact with the back of the chair. This allows you to work closely.  The chair's height should allow your eyes to be level with the upper part of your monitor and your hands to be slightly lower than your elbows. BODY POSITION  Your feet should make contact with the floor. If this is not possible, use a foot rest.  Keep your ears over your shoulders. This will reduce stress on your neck and low back. INCORRECT SITTING POSTURES  If you are feeling tired and unable to assume a healthy sitting posture, do not slouch or slump. This puts excessive strain on your back tissues, causing more damage and pain. Healthier options include:  Using more support, like a lumbar pillow.  Switching tasks to something that requires you to be upright or walking.  Talking a brief walk.  Lying down to rest in a neutral-spine position. PROLONGED STANDING WHILE SLIGHTLY LEANING FORWARD  When completing a task that requires you to lean forward while standing in one place for a long time, place either foot up on a stationary 2-4 inch high object to help maintain the best posture. When both feet are on the ground, the lower back tends to lose its slight inward curve. If this curve flattens (or becomes too large), then the back and your other joints will experience too much stress, tire more quickly, and can cause pain. CORRECT STANDING POSTURES Proper standing posture should be assumed with all daily activities, even if they only take a few moments, like when brushing your teeth. As in sitting, your ears should fall over your shoulders and your shoulders should fall over your hips. You should keep a slight tension in your abdominal muscles to brace your spine. Your tailbone should point down to the ground, not behind your body, resulting in an over-extended swayback posture.  INCORRECT STANDING POSTURES  Common incorrect standing  postures include a forward head, locked knees and/or an excessive swayback. WALKING Walk with an upright posture. Your ears, shoulders and hips should all line-up. PROLONGED ACTIVITY IN A FLEXED POSITION When completing a task that requires you to bend forward at your waist or lean over a low surface, try to find a way to stabilize 3 out of 4 of your limbs. You can place a hand or elbow on your thigh or rest a knee on the surface you are reaching across. This will provide you more stability, so that your muscles do not tire as quickly. By keeping your knees relaxed, or slightly bent, you will also reduce stress across your lower back. CORRECT LIFTING TECHNIQUES DO :  Assume a wide stance. This will provide you more stability and the opportunity to get as close as possible to the object which you are lifting.  Tense your abdominals to brace your spine. Bend at the knees and hips. Keeping your back locked in a neutral-spine position, lift using your leg muscles. Lift with your legs, keeping your back straight.  Test the weight of unknown objects before attempting to lift them.  Try to keep your elbows locked down at your sides in order get the best strength from your shoulders when carrying an  object.  Always ask for help when lifting heavy or awkward objects. INCORRECT LIFTING TECHNIQUES DO NOT:   Lock your knees when lifting, even if it is a small object.  Bend and twist. Pivot at your feet or move your feet when needing to change directions.  Assume that you can safely pick up even a paperclip without proper posture. Document Released: 03/15/2005 Document Revised: 06/07/2011 Document Reviewed: 06/27/2008 [facility] Patient Information 2015 [place], [place]. This information is not intended to replace advice given to you by your health care provider. Make sure you discuss any questions you have with your health care provider.

## 2014-12-16 NOTE — Assessment & Plan Note (Signed)
Question episodes of blackout/syncope as described by patient. Events occur infrequently noting 4 times in the past 2 years. Previous EKG with mild ST elevation in the emergency room and normal sinus rhythm. Obtain 2-D echocardiogram. Follow up pending blood work.

## 2014-12-16 NOTE — Assessment & Plan Note (Signed)
Cervical pain most likely related to previous surgery and restricted range of motion. Continue conservative treatment with home exercise therapy, heat, and stretching. Refer to formalized physical therapy. May require additional imaging if no improvement is noted.

## 2014-12-16 NOTE — Assessment & Plan Note (Addendum)
Left-sided lower back pain consistent with possible disc pathology and/or sciatic nerve root irritation. Start prednisone. Discussed risks and benefits of medication with patient and encouraged patient compliance. Continue home exercise therapy with heat and ice. Refer to physical therapy for further evaluation and treatment. May require additional imaging and possible referral to orthopedics for injections. Continue work as tolerated.

## 2014-12-17 ENCOUNTER — Telehealth (HOSPITAL_COMMUNITY): Payer: Self-pay | Admitting: *Deleted

## 2014-12-25 ENCOUNTER — Telehealth: Payer: Self-pay | Admitting: Family

## 2014-12-25 DIAGNOSIS — R55 Syncope and collapse: Secondary | ICD-10-CM

## 2014-12-25 NOTE — Telephone Encounter (Signed)
MRI of cervical spine placed. I would like to complete the 2D echo to rule out cardiac origin.

## 2014-12-25 NOTE — Addendum Note (Signed)
Addended by: Jeanine Luz D on: 12/25/2014 10:58 PM   Modules accepted: Orders

## 2014-12-25 NOTE — Telephone Encounter (Signed)
Please find about the status of his 2D echocardiogram. There was no discussion regarding an MRI as we are ruling out cardiac issues, although given his neck surgeries may additional imaging.

## 2014-12-25 NOTE — Telephone Encounter (Signed)
Called wife to let her know that 2D echo was scheduled and cardiology had called on 9/20 to set up the appt. I advised that she call back to schedule that appt. She is also requesting an MRI to be done on pts neck and back as soon as possible. They are more concerned about that. Please advise.

## 2014-12-25 NOTE — Telephone Encounter (Signed)
pts wife called stating patient passed out again and that Tammy Sours was supposed to order an MRI and she thinks he needs to get that done asap. Not sure if you can talk with her. She works in the building and you can call her at [phone]

## 2014-12-26 ENCOUNTER — Encounter: Payer: Self-pay | Admitting: Family

## 2014-12-26 ENCOUNTER — Ambulatory Visit (INDEPENDENT_AMBULATORY_CARE_PROVIDER_SITE_OTHER): Payer: BLUE CROSS/BLUE SHIELD | Admitting: Family

## 2014-12-26 VITALS — BP 120/80 | HR 70 | Temp 98.4°F | Wt 216.0 lb

## 2014-12-26 DIAGNOSIS — M542 Cervicalgia: Secondary | ICD-10-CM

## 2014-12-26 DIAGNOSIS — R55 Syncope and collapse: Secondary | ICD-10-CM

## 2014-12-26 NOTE — Patient Instructions (Signed)
Thank you for choosing [facility].  Summary/Instructions:  Please stop by radiology on the basement level of the building for your x-rays. Your results will be released to MyChart (or called to you) after review, usually within 72 hours after test completion. If any treatments or changes are necessary, you will be notified at that same time.  If your symptoms worsen or fail to improve, please contact our office for further instruction, or in case of emergency go directly to the emergency room at the closest medical facility.     

## 2014-12-26 NOTE — Progress Notes (Signed)
   Subjective:    Patient ID: Trevor Jones, male    DOB: 1965/10/27, 49 y.o.   MRN: [id number]  Chief Complaint  Patient presents with  . Dizziness    yesterday  . Loss of Consciousness    yesterday for a few seconds    HPI:  Trevor Jones is a 49 y.o. male who  has a past medical history of Hypertension; Neck pain; and Decreased hearing of left ear. and presents today for a follow up office visit.   Previously seen in the office for blacking out which has been occuring since his previous neck surgery. A 2D echocardiogram was ordered and the patient was contacted the following day to schedule and has not been completed to this point. Returns today for dizziness and a potential loss of consciousness for that lasted a few seconds. Notes that he began neck and back pain. Reports that he his neck painNoted that his back and neck started hurting and felt the dizziness.   No Known Allergies   No current outpatient prescriptions on file prior to visit.   No current facility-administered medications on file prior to visit.     Past Surgical History  Procedure Laterality Date  . Cervical spine surgery      Review of Systems  Constitutional: Negative for fever and chills.  Respiratory: Negative for chest tightness, shortness of breath and wheezing.   Cardiovascular: Negative for chest pain, palpitations and leg swelling.  Musculoskeletal: Positive for neck pain.  Neurological: Positive for dizziness and syncope.      Objective:    BP 120/80 mmHg  Pulse 70  Temp(Src) 98.4 F (36.9 C) (Oral)  Wt 216 lb (97.977 kg)  SpO2 97% Nursing note and vital signs reviewed.  Physical Exam  Constitutional: He is oriented to person, place, and time. He appears well-developed and well-nourished. No distress.  Eyes: Conjunctivae and EOM are normal. Pupils are equal, round, and reactive to light.  Neck: Neck supple.  No obvious deformity, discoloration, or edema. Continues to  experience mild tenderness midline neck and paraspinal musculature. Restricted range of motion lateral bending. Negative Spurling's and compression.  Cardiovascular: Normal rate, regular rhythm, normal heart sounds and intact distal pulses.   Pulmonary/Chest: Effort normal and breath sounds normal.  Neurological: He is alert and oriented to person, place, and time. No cranial nerve deficit.  Skin: Skin is warm and dry.  Psychiatric: He has a normal mood and affect. His behavior is normal. Judgment and thought content normal.       Assessment & Plan:   Problem List Items Addressed This Visit      Cardiovascular and Mediastinum   Black-out (not amnesia) - Primary    Black outs and described syncope of undetermined origin. Previously ordered 2D echo continues to await completion. Refer to [facility]uosurgery, [facility] per patient request. Will call to complete 2D Echo. Continue to monitor symptoms and seek emergency care for any worsening. FMLA papers received to be completed and work note provided.       Relevant Orders   Ambulatory referral to Neurosurgery     Other   Cervical pain   Relevant Orders   Ambulatory referral to Neurosurgery

## 2014-12-26 NOTE — Assessment & Plan Note (Signed)
Black outs and described syncope of undetermined origin. Previously ordered 2D echo continues to await completion. Refer to [facility]uosurgery, [facility] per patient request. Will call to complete 2D Echo. Continue to monitor symptoms and seek emergency care for any worsening. FMLA papers received to be completed and work note provided.

## 2014-12-26 NOTE — Telephone Encounter (Signed)
Pt coming in today for OV

## 2014-12-26 NOTE — Progress Notes (Signed)
Pre visit review using our clinic review tool, if applicable. No additional management support is needed unless otherwise documented below in the visit note. 

## 2014-12-31 ENCOUNTER — Telehealth: Payer: Self-pay | Admitting: Family

## 2014-12-31 NOTE — Telephone Encounter (Signed)
Patient called in regarding FMLA paperwork that he brought in to you on his visit on 12/26/2014. I have not seen this paperwork at this point, so i assume you have it?   He states that the dates need to begin on 12/27/2013 for his ailment.

## 2015-01-02 ENCOUNTER — Telehealth: Payer: Self-pay | Admitting: Family

## 2015-01-06 NOTE — Telephone Encounter (Signed)
FMLA paperwork is finished and ready for pickup. LVM letting pt know.

## 2015-01-07 ENCOUNTER — Telehealth: Payer: Self-pay | Admitting: Family

## 2015-01-07 NOTE — Telephone Encounter (Signed)
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  States fax number should be on forms.  He is also wanting to pick up copy.  Patient states there is suppose to be a date of October 1st of 2015 that the forms should be effective from.

## 2015-01-07 NOTE — Telephone Encounter (Signed)
Pt wife called in and wanted to check on the status of pt FMLA paperwork.  She said his job is getting a little aggravated    Best number [phone]

## 2015-01-07 NOTE — Telephone Encounter (Signed)
Messages were left for both patient and wife regarding form completion yesterday and ready for pick up.

## 2015-01-07 NOTE — Telephone Encounter (Signed)
Patient would like to know status on FMLA forms.  Patient states forms have to be turned in soon.

## 2015-01-08 ENCOUNTER — Telehealth (HOSPITAL_COMMUNITY): Payer: Self-pay | Admitting: *Deleted

## 2015-01-08 NOTE — Telephone Encounter (Signed)
Forms have been faxed to the number listed on the front of the form.

## 2015-01-13 ENCOUNTER — Other Ambulatory Visit: Payer: Self-pay | Admitting: Family

## 2015-01-13 DIAGNOSIS — R55 Syncope and collapse: Secondary | ICD-10-CM

## 2015-01-20 ENCOUNTER — Ambulatory Visit (HOSPITAL_COMMUNITY): Payer: BLUE CROSS/BLUE SHIELD | Attending: Internal Medicine

## 2015-01-20 ENCOUNTER — Other Ambulatory Visit: Payer: Self-pay

## 2015-01-20 DIAGNOSIS — I517 Cardiomegaly: Secondary | ICD-10-CM | POA: Insufficient documentation

## 2015-01-20 DIAGNOSIS — I071 Rheumatic tricuspid insufficiency: Secondary | ICD-10-CM | POA: Insufficient documentation

## 2015-01-20 DIAGNOSIS — R55 Syncope and collapse: Secondary | ICD-10-CM | POA: Diagnosis not present

## 2015-01-20 DIAGNOSIS — I10 Essential (primary) hypertension: Secondary | ICD-10-CM | POA: Insufficient documentation

## 2015-01-21 ENCOUNTER — Telehealth: Payer: Self-pay | Admitting: Family

## 2015-01-21 NOTE — Telephone Encounter (Signed)
Please inform patient that his 2D echocardiogram was normal, and therefore his symptoms are most likely related to his neck. Please find out the status of his follow up with neurosurgery.

## 2015-01-22 ENCOUNTER — Telehealth: Payer: Self-pay | Admitting: Family

## 2015-01-22 DIAGNOSIS — R55 Syncope and collapse: Secondary | ICD-10-CM

## 2015-01-22 NOTE — Telephone Encounter (Signed)
Pt's spouse, Trevor Jones, called stated that they went to see cardiologist and brain & spine specialist to determined the reason Trevor Jones is passing out but they can not tell why. Pt' family was wondering if Diabetes is the reason for this, please review lab work and advise.   Call back # [phone].

## 2015-01-24 NOTE — Addendum Note (Signed)
Addended by: Jeanine LuzALONE, GREGORY D on: 01/24/2015 01:05 PM   Modules accepted: Orders

## 2015-01-24 NOTE — Telephone Encounter (Signed)
Called to let pts wife know.

## 2015-01-24 NOTE — Telephone Encounter (Signed)
We have not tested him for any diabetic work. I have placed an A1c for him to complete at his convenience. There is no fasting that needs to be done.

## 2015-01-24 NOTE — Telephone Encounter (Signed)
Spoke with wife and let her know that labs have been placed.

## 2015-01-28 ENCOUNTER — Telehealth: Payer: Self-pay | Admitting: Family

## 2015-01-28 ENCOUNTER — Other Ambulatory Visit (INDEPENDENT_AMBULATORY_CARE_PROVIDER_SITE_OTHER): Payer: BLUE CROSS/BLUE SHIELD

## 2015-01-28 DIAGNOSIS — R55 Syncope and collapse: Secondary | ICD-10-CM

## 2015-01-28 LAB — HEMOGLOBIN A1C: Hgb A1c MFr Bld: 5.1 % (ref 4.6–6.5)

## 2015-01-28 NOTE — Telephone Encounter (Signed)
Patient wife called to advise that patient is currently getting his testing as ordered and to be on the lookout for those. She also requests that we refer him to a neurologist if this testing does not yield necessary results.

## 2015-01-29 ENCOUNTER — Telehealth: Payer: Self-pay | Admitting: Family

## 2015-01-29 NOTE — Telephone Encounter (Signed)
Pt aware of results. Told him per Tammy SoursGreg that we will be back in touch with him shortly for other testing options.

## 2015-01-29 NOTE — Telephone Encounter (Signed)
Wife called in requesting to pick up patient labs.  I notified wife that she is not authorized for us to speak with in regards to patient.  Wife states that she works for [employer] and knows Tammy SoursGreg.  She states that she needs to know as soon as possible the results on patients labs.  Needs to know if he is diabetic.

## 2015-01-29 NOTE — Telephone Encounter (Signed)
Patient informed of normal A1c. Will consult regarding potential need for carotid dopplers or CT/MRI imaging and possible referral to neurology pending.

## 2015-01-29 NOTE — Telephone Encounter (Signed)
Call patient at [phone] to give results.

## 2015-01-29 NOTE — Telephone Encounter (Signed)
Noted  

## 2015-01-29 NOTE — Telephone Encounter (Signed)
Patient called in.  He states it is ok for wife to pick up results.  I did inform him of the importance of adding a DPR to his file.

## 2015-01-30 NOTE — Telephone Encounter (Signed)
I am going to order a carotid doppler as the next step. I can refer to neurology if he would like, but I am not sure it is currently indicated.

## 2015-02-02 ENCOUNTER — Other Ambulatory Visit: Payer: Self-pay | Admitting: Family

## 2015-02-02 DIAGNOSIS — R55 Syncope and collapse: Secondary | ICD-10-CM

## 2015-03-12 ENCOUNTER — Telehealth: Payer: Self-pay

## 2015-03-12 NOTE — Telephone Encounter (Signed)
Left Voice Mail for pt to call back.   RE: Flu Vaccine for 2016  

## 2015-06-10 ENCOUNTER — Telehealth: Payer: Self-pay

## 2015-06-10 NOTE — Telephone Encounter (Signed)
LVM for pt to call back as soon as possible.   RE: Flu vaccine 2016-2017 

## 2015-08-10 DIAGNOSIS — H60502 Unspecified acute noninfective otitis externa, left ear: Secondary | ICD-10-CM | POA: Diagnosis not present

## 2015-08-14 DIAGNOSIS — H60312 Diffuse otitis externa, left ear: Secondary | ICD-10-CM | POA: Diagnosis not present

## 2016-08-13 ENCOUNTER — Ambulatory Visit: Payer: BLUE CROSS/BLUE SHIELD | Admitting: Nurse Practitioner

## 2016-08-13 ENCOUNTER — Ambulatory Visit (INDEPENDENT_AMBULATORY_CARE_PROVIDER_SITE_OTHER): Payer: BLUE CROSS/BLUE SHIELD | Admitting: Nurse Practitioner

## 2016-08-13 ENCOUNTER — Encounter: Payer: Self-pay | Admitting: Nurse Practitioner

## 2016-08-13 VITALS — BP 130/84 | HR 79 | Temp 97.8°F | Ht 65.0 in | Wt 212.0 lb

## 2016-08-13 DIAGNOSIS — H6642 Suppurative otitis media, unspecified, left ear: Secondary | ICD-10-CM

## 2016-08-13 DIAGNOSIS — H9012 Conductive hearing loss, unilateral, left ear, with unrestricted hearing on the contralateral side: Secondary | ICD-10-CM

## 2016-08-13 DIAGNOSIS — H6092 Unspecified otitis externa, left ear: Secondary | ICD-10-CM

## 2016-08-13 DIAGNOSIS — R42 Dizziness and giddiness: Secondary | ICD-10-CM | POA: Diagnosis not present

## 2016-08-13 HISTORY — DX: Unspecified otitis externa, left ear: H60.92

## 2016-08-13 MED ORDER — KETOROLAC TROMETHAMINE 30 MG/ML IJ SOLN
30.0000 mg | Freq: Once | INTRAMUSCULAR | Status: AC
  Administered 2016-08-13: 30 mg via INTRAMUSCULAR

## 2016-08-13 MED ORDER — AMOXICILLIN-POT CLAVULANATE 875-125 MG PO TABS: 1.0000 | ORAL_TABLET | Freq: Two times a day (BID) | ORAL | 0 refills | Status: DC

## 2016-08-13 MED ORDER — CEFTRIAXONE SODIUM 500 MG IJ SOLR
500.0000 mg | Freq: Once | INTRAMUSCULAR | Status: AC
  Administered 2016-08-13: 500 mg via INTRAMUSCULAR

## 2016-08-13 NOTE — Progress Notes (Signed)
Subjective:  Patient ID: Trevor Jones, male    DOB: Apr 27, 1965  Age: 51 y.o. MRN: [id number]  CC: Dizziness (dizzy,headache,left ear pain,high BP going on for 4 days. last ov 2016)   Dizziness  This is a new problem. The current episode started in the past 7 days. The problem occurs intermittently. The problem has been waxing and waning. Associated symptoms include chills, headaches and vertigo. Pertinent negatives include no abdominal pain, anorexia, arthralgias, change in bowel habit, chest pain, congestion, coughing, diaphoresis, fatigue, fever, joint swelling, myalgias, neck pain, numbness, rash, sore throat, swollen glands, urinary symptoms, visual change, vomiting or weakness. The symptoms are aggravated by walking. He has tried nothing for the symptoms.  Otalgia   There is pain in the left ear. This is a new problem. The current episode started in the past 7 days. The problem occurs constantly. There has been no fever. The pain is severe. Associated symptoms include ear discharge, headaches and hearing loss. Pertinent negatives include no abdominal pain, coughing, diarrhea, neck pain, rash, rhinorrhea, sore throat or vomiting. He has tried nothing for the symptoms.    Outpatient Medications Prior to Visit  Medication Sig Dispense Refill  . cyclobenzaprine (FLEXERIL) 5 MG tablet   0  . diclofenac (VOLTAREN) 75 MG EC tablet   0   No facility-administered medications prior to visit.     ROS See HPI  Objective:  BP 130/84   Pulse 79   Temp 97.8 F (36.6 C)   Ht 5\' 5"  (1.651 m)   Wt 212 lb (96.2 kg)   SpO2 98%   BMI 35.28 kg/m   BP Readings from Last 3 Encounters:  08/13/16 130/84  12/26/14 120/80  12/16/14 138/88    Wt Readings from Last 3 Encounters:  08/13/16 212 lb (96.2 kg)  12/26/14 216 lb (98 kg)  12/16/14 214 lb (97.1 kg)    Physical Exam  Constitutional: He is oriented to person, place, and time. No distress.  HENT:  Right Ear: Tympanic membrane,  external ear and ear canal normal. No drainage, swelling or tenderness.  Left Ear: There is drainage, swelling and tenderness. No mastoid tenderness. Decreased hearing is noted.  Nose: Nose normal.  Mouth/Throat: Uvula is midline and oropharynx is clear and moist.  Unable to visualize left ear canal and TM due to swelling and purulent drainage.  Neck: Normal range of motion. Neck supple.  Cardiovascular: Normal rate.   Pulmonary/Chest: Effort normal.  Neurological: He is alert and oriented to person, place, and time.  Vitals reviewed.   Lab Results  Component Value Date   WBC 6.3 12/03/2014   HGB 15.3 12/03/2014   HCT 44.7 12/03/2014   PLT 130 (L) 12/03/2014   GLUCOSE 108 (H) 12/03/2014   NA 133 (L) 12/03/2014   K 4.3 12/03/2014   CL 98 (L) 12/03/2014   CREATININE 0.87 12/03/2014   BUN 14 12/03/2014   CO2 23 12/03/2014   HGBA1C 5.1 01/28/2015    Dg Cervical Spine Complete  Result Date: 12/03/2014 CLINICAL DATA:  Pain following fall earlier today. Numbness in fingers of left hand. EXAM: CERVICAL SPINE  4+ VIEWS COMPARISON:  None. FINDINGS: Frontal, lateral, open-mouth odontoid, and bilateral oblique views were obtained with the patient in collar. The patient is status post anterior screw and plate fixation at C3 and C4 with the screw and plate fixation device appearing intact. There is a disc spacer at C3-4, intact. There is no demonstrable fracture or spondylolisthesis. Prevertebral soft tissues and  predental space regions are within normal limits. There is fairly marked disc space narrowing at C5-6. There is moderate narrowing at C2-3 and C6-7. There is exit foraminal narrowing due to bony hypertrophy at C5-6 bilaterally and at C6-7 bilaterally, more severe on the left than on the right. IMPRESSION: Postoperative change and multilevel osteoarthritic change. No fracture or spondylolisthesis appreciable. Note that no assessment for potential ligamentous injury can be made with in collar  only images. Electronically Signed   By: Bretta BangWilliam  Woodruff Jones M.D.   On: 12/03/2014 07:46   Dg Thoracic Spine W/swimmers  Result Date: 12/03/2014 CLINICAL DATA:  Fall this morning with mid upper back pain. Initial encounter. EXAM: THORACIC SPINE - 3 VIEWS COMPARISON:  None. FINDINGS: Subtle T2 and T3 superior endplate concavities in the lateral projection is attributed to obliquity. There is no definitive fracture. No subluxation. Spondylotic spurring noted at the thoracolumbar junction. IMPRESSION: No subluxation or convincing fracture. Electronically Signed   By: Marnee SpringJonathon  Jones M.D.   On: 12/03/2014 07:51    Assessment & Plan:   Trevor NeedleMichael was seen today for dizziness.  Diagnoses and all orders for this visit:  Dizziness  Suppurative otitis media of left ear, unspecified chronicity -     cefTRIAXone (ROCEPHIN) injection 500 mg; Inject 500 mg into the muscle once. -     ketorolac (TORADOL) 30 MG/ML injection 30 mg; Inject 1 mL (30 mg total) into the muscle once. -     Ambulatory referral to ENT -     amoxicillin-clavulanate (AUGMENTIN) 875-125 MG tablet; Take 1 tablet by mouth 2 (two) times daily.  Conductive hearing loss of left ear, unspecified hearing status on contralateral side   I am having Trevor Jones start on amoxicillin-clavulanate. I am also having him maintain his cyclobenzaprine and diclofenac. We administered cefTRIAXone and ketorolac.  Meds ordered this encounter  Medications  . cefTRIAXone (ROCEPHIN) injection 500 mg  . ketorolac (TORADOL) 30 MG/ML injection 30 mg  . amoxicillin-clavulanate (AUGMENTIN) 875-125 MG tablet    Sig: Take 1 tablet by mouth 2 (two) times daily.    Dispense:  14 tablet    Refill:  0    Order Specific Question:   Supervising Provider    Answer:   Trevor Jones, Trevor V [[id number]]    Follow-up: Return in about 2 weeks (around 08/27/2016) for left ear pain and possible BP elevation.  Trevor Pennaharlotte Nche, NP

## 2016-08-13 NOTE — Patient Instructions (Addendum)
Check BP once a day and record. Bring BP to next office visit.  Otitis Media, Adult Otitis media is redness, soreness, and puffiness (swelling) in the space just behind your eardrum (middle ear). It may be caused by allergies or infection. It often happens along with a cold. Follow these instructions at home:  Take your medicine as told. Finish it even if you start to feel better.  Only take over-the-counter or prescription medicines for pain, discomfort, or fever as told by your doctor.  Follow up with your doctor as told. Contact a doctor if:  You have otitis media only in one ear, or bleeding from your nose, or both.  You notice a lump on your neck.  You are not getting better in 3-5 days.  You feel worse instead of better. Get help right away if:  You have pain that is not helped with medicine.  You have puffiness, redness, or pain around your ear.  You get a stiff neck.  You cannot move part of your face (paralysis).  You notice that the bone behind your ear hurts when you touch it. This information is not intended to replace advice given to you by your health care provider. Make sure you discuss any questions you have with your health care provider. Document Released: 09/01/2007 Document Revised: 08/21/2015 Document Reviewed: 10/10/2012 Elsevier Interactive Patient Education  2017 [employer]

## 2016-08-16 ENCOUNTER — Telehealth: Payer: Self-pay | Admitting: Nurse Practitioner

## 2016-08-16 DIAGNOSIS — R42 Dizziness and giddiness: Secondary | ICD-10-CM | POA: Diagnosis not present

## 2016-08-16 DIAGNOSIS — H6062 Unspecified chronic otitis externa, left ear: Secondary | ICD-10-CM | POA: Diagnosis not present

## 2016-08-16 DIAGNOSIS — R079 Chest pain, unspecified: Secondary | ICD-10-CM

## 2016-08-16 NOTE — Telephone Encounter (Signed)
Left vm for pt, just checking up on how pt is doing since Friday?

## 2016-08-17 DIAGNOSIS — R42 Dizziness and giddiness: Secondary | ICD-10-CM | POA: Diagnosis not present

## 2016-08-17 DIAGNOSIS — R030 Elevated blood-pressure reading, without diagnosis of hypertension: Secondary | ICD-10-CM | POA: Diagnosis not present

## 2016-08-17 DIAGNOSIS — R0602 Shortness of breath: Secondary | ICD-10-CM | POA: Diagnosis not present

## 2016-08-17 DIAGNOSIS — Z981 Arthrodesis status: Secondary | ICD-10-CM | POA: Diagnosis not present

## 2016-08-17 DIAGNOSIS — R079 Chest pain, unspecified: Secondary | ICD-10-CM | POA: Diagnosis not present

## 2016-08-17 NOTE — Telephone Encounter (Signed)
Trevor SoursGreg or Trevor Jones please advise, pt last ov with Trevor Jones was 2016 but he saw Trevor GowerCharlotte recently. Can we refer him to cardiologist for sooner appt.

## 2016-08-17 NOTE — Telephone Encounter (Signed)
Pt was [facility] this morning, Pt was referred to a cardiologists, the cardiologists he was referred to cannot see him until June, they would like to know if he can be referred somewhere else to be seen sooner.

## 2016-08-17 NOTE — Telephone Encounter (Signed)
We have left several messges on patient's cell phone/home phone---we are calling patient's wife to tell her to tell him to call us---wife is not on DPR---per lori/referral , [facility] can see him urgently (within about 7 days)---wife will let husband know to call us---patient can talk with Broden Holt of lori and/or both---patient has appt for referral to ENT on 5/24 that lori needs to confirm and Maziah Smola is wanting to let patient know about referral being placed by greg/pcp for urgency to cardiology, that office will call him to schedule appt quickly

## 2016-08-17 NOTE — Telephone Encounter (Signed)
Wife is on DPR - ok to talk with her

## 2016-08-17 NOTE — Telephone Encounter (Signed)
Referral to cardiology placed.

## 2016-08-19 ENCOUNTER — Encounter: Payer: Self-pay | Admitting: Family

## 2016-08-19 ENCOUNTER — Ambulatory Visit (INDEPENDENT_AMBULATORY_CARE_PROVIDER_SITE_OTHER): Payer: BLUE CROSS/BLUE SHIELD | Admitting: Family

## 2016-08-19 VITALS — BP 132/88 | HR 79 | Temp 98.0°F | Resp 16 | Ht 65.0 in | Wt 204.0 lb

## 2016-08-19 DIAGNOSIS — H60312 Diffuse otitis externa, left ear: Secondary | ICD-10-CM | POA: Diagnosis not present

## 2016-08-19 DIAGNOSIS — R55 Syncope and collapse: Secondary | ICD-10-CM

## 2016-08-19 DIAGNOSIS — R030 Elevated blood-pressure reading, without diagnosis of hypertension: Secondary | ICD-10-CM

## 2016-08-19 HISTORY — DX: Elevated blood-pressure reading, without diagnosis of hypertension: R03.0

## 2016-08-19 MED ORDER — NEOMYCIN-POLYMYXIN-HC 1 % OT SOLN: 3.0000 [drp] | Freq: Four times a day (QID) | OTIC | 0 refills | Status: DC

## 2016-08-19 NOTE — Assessment & Plan Note (Signed)
Continues to experience dizziness and syncopal episodes of questionable origin. Previous workup has been negative thus far. Question possible seizure activity versus neurogenic origin. Has follow-up with cardiology. Continue to monitor and follow-up if symptoms worsen or do not improve pending cardiology referral.

## 2016-08-19 NOTE — Patient Instructions (Signed)
Thank you for choosing [facility].  SUMMARY AND INSTRUCTIONS:  Please start the cortisporin.  Follow up with cardiology.  Monitor your blood pressure at home at least daily and record those numbers to bring to cardiology.  Follow up in 1 month or sooner if needed.   Medication:  Your prescription(s) have been submitted to your pharmacy or been printed and provided for you. Please take as directed and contact our office if you believe you are having problem(s) with the medication(s) or have any questions.  Follow up:  If your symptoms worsen or fail to improve, please contact our office for further instruction, or in case of emergency go directly to the emergency room at the closest medical facility.

## 2016-08-19 NOTE — Progress Notes (Signed)
Subjective:    Patient ID: Trevor Jones, male    DOB: 1965-12-06, 51 y.o.   MRN: [id number]  Chief Complaint  Patient presents with  . Hypertension    check BP, FMLA needs it for the syncope episodes that keep happening    HPI:  Trevor Jones is a 51 y.o. male who  has a past medical history of Decreased hearing of left ear; Hypertension; and Neck pain. and presents today for a follow up office visit.   1.) Blood pressure - Has had previous episodes of elevated blood pressures recently and not currently checking his blood pressure at home. Not currently maintained on medication. Denies worst headache of life or new symptoms of end organ damage.   BP Readings from Last 3 Encounters:  08/19/16 132/88  08/13/16 130/84  12/26/14 120/80   2.) Syncopal episodes - Continues to experience episodes of syncope with Monday or Tuesday evening being the last episode. Frequency varies to to one time a week to 1 time a month. Episodes start with dizziness and then a headache. He was seen in the ED at [facility] with elevated blood pressure and chest pain. He has been out of work with instructions to stay out of work until Tuesday of next week. Requesting FMLA paperwork in regards to his continuous episodes. Does note increased levels of stress.   3.) Ear pain - appears a diagnosed with otitis externa and started on Augmentin. Reports taking the medication as prescribed and denies adverse side effects. Continues to experience mild ear pain and discharge. No fevers. Denies decrease in hearing.   No Known Allergies    Outpatient Medications Prior to Visit  Medication Sig Dispense Refill  . cyclobenzaprine (FLEXERIL) 5 MG tablet   0  . diclofenac (VOLTAREN) 75 MG EC tablet   0  . amoxicillin-clavulanate (AUGMENTIN) 875-125 MG tablet Take 1 tablet by mouth 2 (two) times daily. 14 tablet 0   No facility-administered medications prior to visit.       Past Surgical History:  Procedure  Laterality Date  . CERVICAL SPINE SURGERY        Past Medical History:  Diagnosis Date  . Decreased hearing of left ear   . Hypertension   . Neck pain       Review of Systems  Constitutional: Negative for chills and fever.  Eyes:       Negative for changes in vision  Respiratory: Negative for cough, chest tightness and wheezing.   Cardiovascular: Negative for palpitations and leg swelling.  Neurological: Positive for syncope and weakness. Negative for dizziness and light-headedness.      Objective:    BP 132/88 (BP Location: Left Arm, Patient Position: Sitting, Cuff Size: Large)   Pulse 79   Temp 98 F (36.7 C) (Oral)   Resp 16   Ht 5\' 5"  (1.651 m)   Wt 204 lb (92.5 kg)   SpO2 98%   BMI 33.95 kg/m  Nursing note and vital signs reviewed.  Physical Exam  Constitutional: He is oriented to person, place, and time. He appears well-developed and well-nourished. No distress.  HENT:  Right Ear: Hearing, tympanic membrane, external ear and ear canal normal.  Left Ear: There is drainage and swelling. No tenderness. A middle ear effusion is present. No decreased hearing is noted.  Cardiovascular: Normal rate, regular rhythm, normal heart sounds and intact distal pulses.  Exam reveals no gallop and no friction rub.   No murmur heard. Pulmonary/Chest: Effort normal and  breath sounds normal. No respiratory distress. He has no wheezes. He has no rales. He exhibits no tenderness.  Neurological: He is alert and oriented to person, place, and time.  Skin: Skin is warm and dry.  Psychiatric: He has a normal mood and affect. His behavior is normal. Judgment and thought content normal.       Assessment & Plan:   Problem List Items Addressed This Visit      Cardiovascular and Mediastinum   Syncope    Continues to experience dizziness and syncopal episodes of questionable origin. Previous workup has been negative thus far. Question possible seizure activity versus neurogenic  origin. Has follow-up with cardiology. Continue to monitor and follow-up if symptoms worsen or do not improve pending cardiology referral.        Nervous and Auditory   Otitis externa of left ear - Primary    Continues to experience symptoms otitis externa. Start Cortisporin. Referral previously placed to ear nose and throat is in process. Follow-up if symptoms worsen or do not improve.        Other   Borderline blood pressure    No evidence of hypertension noted presently. Encouraged to monitor blood pressure at home and follow low-sodium diet. Record values and bring them to cardiology office visit next week. Denies worse headache of life with no symptoms of end organ damage noted on physical exam. Follow-up in one month or sooner.          I have discontinued Mr. Consuegra amoxicillin-clavulanate. I am also having him start on NEOMYCIN-POLYMYXIN-HYDROCORTISONE. Additionally, I am having him maintain his cyclobenzaprine and diclofenac.   Meds ordered this encounter  Medications  . NEOMYCIN-POLYMYXIN-HYDROCORTISONE (CORTISPORIN) 1 % SOLN otic solution    Sig: Place 3 drops into the left ear 4 (four) times daily.    Dispense:  10 mL    Refill:  0    Order Specific Question:   Supervising Provider    Answer:   Hillard Danker A [[id number]]     Follow-up: Return in about 1 month (around 09/19/2016), or if symptoms worsen or fail to improve.  Jeanine Luz, FNP

## 2016-08-19 NOTE — Assessment & Plan Note (Signed)
Continues to experience symptoms otitis externa. Start Cortisporin. Referral previously placed to ear nose and throat is in process. Follow-up if symptoms worsen or do not improve.

## 2016-08-19 NOTE — Assessment & Plan Note (Signed)
No evidence of hypertension noted presently. Encouraged to monitor blood pressure at home and follow low-sodium diet. Record values and bring them to cardiology office visit next week. Denies worse headache of life with no symptoms of end organ damage noted on physical exam. Follow-up in one month or sooner.

## 2016-08-20 DIAGNOSIS — Z0279 Encounter for issue of other medical certificate: Secondary | ICD-10-CM

## 2016-08-24 ENCOUNTER — Ambulatory Visit (INDEPENDENT_AMBULATORY_CARE_PROVIDER_SITE_OTHER): Payer: BLUE CROSS/BLUE SHIELD | Admitting: Cardiology

## 2016-08-24 ENCOUNTER — Encounter: Payer: Self-pay | Admitting: Cardiology

## 2016-08-24 ENCOUNTER — Encounter (INDEPENDENT_AMBULATORY_CARE_PROVIDER_SITE_OTHER): Payer: Self-pay

## 2016-08-24 VITALS — BP 132/70 | HR 83 | Ht 65.0 in | Wt 212.0 lb

## 2016-08-24 DIAGNOSIS — R55 Syncope and collapse: Secondary | ICD-10-CM

## 2016-08-24 DIAGNOSIS — R079 Chest pain, unspecified: Secondary | ICD-10-CM | POA: Diagnosis not present

## 2016-08-24 NOTE — Progress Notes (Signed)
Cardiology Office Note    Date:  08/24/2016   ID:  Trevor Jones, DOB 11-Oct-1965, MRN [id number]  PCP:  Veryl Speak, FNP  Cardiologist:  Armanda Magic, MD   Chief Complaint  Patient presents with  . New Evaluation    syncope and chest pain    History of Present Illness:  Trevor Jones is a 51 y.o. male who is being seen today for the evaluation of dizziness and syncope at the request of Veryl Speak, FNP.  He has a history of borderline HTN and has recently been having episodes of syncope.  He is here with his wife and she says that he has had multiple episodes of chest pain and syncope.  The chest pain is right sided and is described as a sharp pain that lasts off and on for about a half a day at a time.  It radiates into his left neck and also his left leg locks up.  The pain is nonexertional and stress makes it worse. He gets diaphoretic and nauseated with the pain.  He has also felt DOE but not a lot.  He denies any significant PND or orthopnea.  He denies any LE edema.  He says that he will start to feel lightheaded "fuzzy" associated with a left sided headache and then his legs get very weak and then has syncope.  These have been unwitnessed but he denies any urinary or fecal incontinence.  His wife thinks it is because he has not eaten.  He denies any tongue biting or injury to himself after fainting.  He is usually fully oriented when he comes to.  He denies any palpitations prior to the event or after.      Past Medical History:  Diagnosis Date  . Borderline blood pressure 08/19/2016  . Cervical pain 12/16/2014  . Decreased hearing of left ear   . Hypertension   . Left-sided low back pain with left-sided sciatica 12/16/2014  . Neck pain   . Otitis externa of left ear 08/13/2016  . Syncope 12/16/2014    Past Surgical History:  Procedure Laterality Date  . CERVICAL SPINE SURGERY      Current Medications: Current Meds  Medication Sig  . cyclobenzaprine  (FLEXERIL) 5 MG tablet   . diclofenac (VOLTAREN) 75 MG EC tablet   . NEOMYCIN-POLYMYXIN-HYDROCORTISONE (CORTISPORIN) 1 % SOLN otic solution Place 3 drops into the left ear 4 (four) times daily.    Allergies:   Patient has no known allergies.   Social History   Social History  . Marital status: Married    Spouse name: N/A  . Number of children: 0  . Years of education: 14   Occupational History  . Scientist, physiological     Social History Main Topics  . Smoking status: Never Smoker  . Smokeless tobacco: Never Used  . Alcohol use No  . Drug use: No  . Sexual activity: Not Asked   Other Topics Concern  . None   Social History Narrative   Denies religious beliefs effecting health care.      Family History:  The patient's family history includes Cancer in his father and mother.   ROS:   Please see the history of present illness.    ROS All other systems reviewed and are negative.  No flowsheet data found.   Orthostatic VS for the past 24 hrs:  BP- Lying Pulse- Lying BP- Sitting Pulse- Sitting BP- Standing at 0 minutes Pulse- Standing at  0 minutes  08/24/16 1530 124/84 75 118/87 78 (!) 119/91 83      PHYSICAL EXAM:   VS:  BP 132/70   Pulse 83   Ht 5\' 5"  (1.651 m)   Wt 212 lb (96.2 kg)   BMI 35.28 kg/m    GEN: Well nourished, well developed, in no acute distress  HEENT: normal  Neck: no JVD, carotid bruits, or masses Cardiac: RRR; no murmurs, rubs, or gallops,no edema.  Intact distal pulses bilaterally.  Respiratory:  clear to auscultation bilaterally, normal work of breathing GI: soft, nontender, nondistended, + BS MS: no deformity or atrophy  Skin: warm and dry, no rash Neuro:  Alert and Oriented x 3, Strength and sensation are intact Psych: euthymic mood, full affect  Wt Readings from Last 3 Encounters:  08/24/16 212 lb (96.2 kg)  08/19/16 204 lb (92.5 kg)  08/13/16 212 lb (96.2 kg)      Studies/Labs Reviewed:   EKG:  EKG is ordered today.   The ekg ordered today demonstrates NSR at 84bpm with no ST changes and normal intervals.  Recent Labs: No results found for requested labs within last 8760 hours.   Lipid Panel No results found for: CHOL, TRIG, HDL, CHOLHDL, VLDL, LDLCALC, LDLDIRECT  Additional studies/ records that were reviewed today include:  OV notes from PCP     ASSESSMENT:    1. Syncope, unspecified syncope type   2. Chest pain, unspecified type      PLAN:  In order of problems listed above:  1. Syncope - somewhat atypical.  It is not associated with palpitations but does get nauseated and diaphoretic prior to the event.  He says that he also gets a headache and a fuzzy feeling on one side of his face.  His legs then get weak and lock up on him.  His wife is worried it could be low BS.  His EKG is normal. Orthostatics are normal.  I will get a 30 day heart monitor to assess further.   2. Chest pain - atypical in presentation- right sided, sharp and worse with breathing.  Also associated with left neck pain.  Suspect this is MSK pain and there is no association with exertion.  He thinks his father may have had heart disease and he has HTN.  I will get a nuclear stress test to rule out ischemia and 2D echo to assess LVF.      Medication Adjustments/Labs and Tests Ordered: Current medicines are reviewed at length with the patient today.  Concerns regarding medicines are outlined above.  Medication changes, Labs and Tests ordered today are listed in the Patient Instructions below.  Patient Instructions  Medication Instructions:  Your physician recommends that you continue on your current medications as directed. Please refer to the Current Medication list given to you today.   Labwork: None   Testing/Procedures: Your physician has requested that you have en exercise stress myoview. For further information please visit [email or web address] Please follow instruction sheet, as given.  Your physician has  requested that you have an echocardiogram. Echocardiography is a painless test that uses sound waves to create images of your heart. It provides your doctor with information about the size and shape of your heart and how well your heart's chambers and valves are working. This procedure takes approximately one hour. There are no restrictions for this procedure.  Your physician has recommended that you wear an event monitor. Event monitors are medical devices that record the heart's electrical activity.  Doctors most often us these monitors to diagnose arrhythmias. Arrhythmias are problems with the speed or rhythm of the heartbeat. The monitor is a small, portable device. You can wear one while you do your normal daily activities. This is usually used to diagnose what is causing palpitations/syncope (passing out).  30 DAY  Follow-Up: Your physician recommends that you schedule a follow-up appointment as needed with Dr Mayford Knifeurner.        If you need a refill on your cardiac medications before your next appointment, please call your pharmacy.      Signed, Armanda Magicraci Elmar Antigua, MD  08/24/2016 3:31 PM    [facility] [address], [place], [place]  [address] Phone: [phone]; Fax: [phone]

## 2016-08-24 NOTE — Patient Instructions (Addendum)
Medication Instructions:  Your physician recommends that you continue on your current medications as directed. Please refer to the Current Medication list given to you today.   Labwork: None   Testing/Procedures: Your physician has requested that you have en exercise stress myoview. For further information please visit [email or web address]. Please follow instruction sheet, as given.  Your physician has requested that you have an echocardiogram. Echocardiography is a painless test that uses sound waves to create images of your heart. It provides your doctor with information about the size and shape of your heart and how well your heart's chambers and valves are working. This procedure takes approximately one hour. There are no restrictions for this procedure.   Your physician has requested that you have a carotid duplex. This test is an ultrasound of the carotid arteries in your neck. It looks at blood flow through these arteries that supply the brain with blood. Allow one hour for this exam. There are no restrictions or special instructions.    Your physician has recommended that you wear an event monitor. Event monitors are medical devices that record the heart's electrical activity. Doctors most often us these monitors to diagnose arrhythmias. Arrhythmias are problems with the speed or rhythm of the heartbeat. The monitor is a small, portable device. You can wear one while you do your normal daily activities. This is usually used to diagnose what is causing palpitations/syncope (passing out).  30 DAY  Follow-Up: Your physician recommends that you schedule a follow-up appointment as needed with Dr Mayford Knifeurner.        If you need a refill on your cardiac medications before your next appointment, please call your pharmacy.

## 2016-08-26 ENCOUNTER — Other Ambulatory Visit: Payer: Self-pay | Admitting: Cardiology

## 2016-08-26 DIAGNOSIS — R55 Syncope and collapse: Secondary | ICD-10-CM

## 2016-08-27 ENCOUNTER — Ambulatory Visit (HOSPITAL_COMMUNITY)
Admission: RE | Admit: 2016-08-27 | Discharge: 2016-08-27 | Disposition: A | Discharge status: Home / Self Care | Payer: BLUE CROSS/BLUE SHIELD | Source: Ambulatory Visit | Attending: Cardiology | Admitting: Cardiology

## 2016-08-27 ENCOUNTER — Ambulatory Visit: Payer: BLUE CROSS/BLUE SHIELD | Admitting: Family

## 2016-08-27 DIAGNOSIS — I10 Essential (primary) hypertension: Secondary | ICD-10-CM | POA: Insufficient documentation

## 2016-08-27 DIAGNOSIS — R55 Syncope and collapse: Secondary | ICD-10-CM | POA: Diagnosis not present

## 2016-08-27 DIAGNOSIS — I6523 Occlusion and stenosis of bilateral carotid arteries: Secondary | ICD-10-CM | POA: Diagnosis not present

## 2016-08-31 ENCOUNTER — Telehealth: Payer: Self-pay | Admitting: Family

## 2016-08-31 ENCOUNTER — Encounter (HOSPITAL_COMMUNITY): Payer: Self-pay | Admitting: Cardiology

## 2016-08-31 NOTE — Telephone Encounter (Signed)
Patient has called the office several times today in regards to disability ppwk. Pt informed several times that we completed FMLA ppwk last week, but have not received anything regarding disability. Pt stated we were "hiding the forms' and hung up. Proceeded to call back and hang up several times. Pt wife also contacted the office in regards to this ppwk. I informed his wife that we have not received any disability forms for him. Also informed wife that patient's behavior and treatment of the staff is putting him at risk of dismissal, stated she understood and would talk to him. Any future negative/abusive behavior towards this office and I will recommend patient's dismissal.

## 2016-08-31 NOTE — Telephone Encounter (Signed)
[employer] Disability benefits received via fax at 4:15pm. Forms have been numbered. & Given to [employer] for Marcos EkeGreg Calone to advise.

## 2016-09-01 ENCOUNTER — Telehealth (HOSPITAL_COMMUNITY): Payer: Self-pay | Admitting: *Deleted

## 2016-09-01 ENCOUNTER — Telehealth: Payer: Self-pay | Admitting: Cardiology

## 2016-09-01 NOTE — Telephone Encounter (Signed)
Spoke with wife and advised her of the note below. Paper work has been faxed to cardiology.

## 2016-09-01 NOTE — Telephone Encounter (Signed)
Patient given detailed instructions per Myocardial Perfusion Study Information Sheet for the test on 09/03/16. Patient notified to arrive 15 minutes early and that it is imperative to arrive on time for appointment to keep from having the test rescheduled.  If you need to cancel or reschedule your appointment, please call the office within 24 hours of your appointment. . Patient verbalized understanding  Trevor Jones [name]     

## 2016-09-01 NOTE — Telephone Encounter (Signed)
Received paperwork to complete. Since he is being followed by a Speciality I do not have anything to add to the current paperwork and would recommend they complete the papers as they continue to follow him at this time.

## 2016-09-01 NOTE — Telephone Encounter (Signed)
Patient wife called to clarify myoview instructions. Informed her to have patient hold caffeine and decaffeinated products, chocolate and nicotine for 12 hours, and to hold food and drink (other than water) totally for 2 hours prior to the test. She was grateful for call.

## 2016-09-01 NOTE — Telephone Encounter (Signed)
Trevor Jones is calling to get clarification about the test that he is having on Friday .Marland Kitchen. Please call

## 2016-09-01 NOTE — Progress Notes (Signed)
Please try to get in touch with patient and if you cant then send certified letter

## 2016-09-01 NOTE — Progress Notes (Signed)
Spoke with patient's wife (DPR). She reports she spoke with scheduling and they are coming back Friday to complete imaging.

## 2016-09-03 ENCOUNTER — Ambulatory Visit (HOSPITAL_COMMUNITY)
Admission: RE | Admit: 2016-09-03 | Discharge: 2016-09-03 | Disposition: A | Discharge status: Home / Self Care | Payer: BLUE CROSS/BLUE SHIELD | Source: Ambulatory Visit | Attending: Cardiology | Admitting: Cardiology

## 2016-09-03 ENCOUNTER — Ambulatory Visit (HOSPITAL_BASED_OUTPATIENT_CLINIC_OR_DEPARTMENT_OTHER): Payer: BLUE CROSS/BLUE SHIELD

## 2016-09-03 ENCOUNTER — Other Ambulatory Visit: Payer: Self-pay

## 2016-09-03 DIAGNOSIS — R55 Syncope and collapse: Secondary | ICD-10-CM

## 2016-09-03 LAB — MYOCARDIAL PERFUSION IMAGING
LV dias vol: 119 mL (ref 62–150)
LV sys vol: 59 mL
Peak HR: 103 {beats}/min
RATE: 0.27
Rest HR: 62 {beats}/min
SDS: 0
SRS: 1
SSS: 1
TID: 1.02

## 2016-09-03 MED ORDER — TECHNETIUM TC 99M TETROFOSMIN IV KIT
30.7000 | PACK | Freq: Once | INTRAVENOUS | Status: AC | PRN
  Administered 2016-09-03: 30.7 via INTRAVENOUS
  Filled 2016-09-03: qty 31

## 2016-09-03 MED ORDER — REGADENOSON 0.4 MG/5ML IV SOLN
0.4000 mg | Freq: Once | INTRAVENOUS | Status: AC
  Administered 2016-09-03: 0.4 mg via INTRAVENOUS

## 2016-09-03 MED ORDER — TECHNETIUM TC 99M TETROFOSMIN IV KIT
10.1000 | PACK | Freq: Once | INTRAVENOUS | Status: AC | PRN
  Administered 2016-09-03: 10.1 via INTRAVENOUS
  Filled 2016-09-03: qty 11

## 2016-09-06 ENCOUNTER — Telehealth: Payer: Self-pay | Admitting: Cardiology

## 2016-09-06 NOTE — Telephone Encounter (Signed)
New message    Pt is calling to let Dr. Mayford Knifeurner know he had an episode. He also wants to know his test results.  Pt c/o Syncope: STAT if syncope occurred within 30 minutes and pt complains of lightheadedness High Priority if episode of passing out, completely, today or in last 24 hours   1. Did you pass out today? No   2. When is the last time you passed out? Yesterday pt states he blacked out he didn't pass out   3. Has this occurred multiple times?   4. Did you have any symptoms prior to passing out? Fuzzy and dizzy legs got weak

## 2016-09-06 NOTE — Telephone Encounter (Signed)
Spoke to patient and his wife. Informed patient of stress test and myoview results and verbal understanding expressed.   Patient states he had an episode Friday and yesterday where he was dizzy and his legs felt weak. He did not faint or lose consciousness.  Sunday, he felt dizzy and he ate and he felt better. 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 Encouraged patient to stay well hydrated and to make sure he eats adequately. Reiterated to patient to keep appointment for event monitor 6/13 in case his symptoms are due to his heart. He was grateful for call and agrees with treatment plan.

## 2016-09-06 NOTE — Telephone Encounter (Signed)
Left message to call back  

## 2016-09-08 ENCOUNTER — Ambulatory Visit (INDEPENDENT_AMBULATORY_CARE_PROVIDER_SITE_OTHER): Payer: BLUE CROSS/BLUE SHIELD

## 2016-09-08 DIAGNOSIS — R55 Syncope and collapse: Secondary | ICD-10-CM

## 2016-09-09 ENCOUNTER — Telehealth: Payer: Self-pay | Admitting: Cardiology

## 2016-09-09 NOTE — Telephone Encounter (Signed)
Per DPR form, left message with results on VM. Instructed DPR to call back if she has further questions.

## 2016-09-09 NOTE — Telephone Encounter (Signed)
New message   Pt wife verbalized that she is calling to speak to the rn   to give him information on the monitor and his other test he had done on 09/03/2016  She wants a detailed message left on the vm if she does not answer

## 2016-09-09 NOTE — Telephone Encounter (Signed)
-----   Message from Quintella Reichertraci R Turner, MD sent at 09/07/2016 11:19 PM EDT ----- Left carotid artery appears patent  Traci ----- Message ----- From: Henrietta DineKemp, Jorma Tassinari A, RN Sent: 09/07/2016   4:01 PM To: Quintella Reichertraci R Turner, MD  The rest of this patient's imaging (for the L carotid) are located in the scan at the bottom called "limited." thank you!

## 2016-09-16 ENCOUNTER — Telehealth: Payer: Self-pay | Admitting: Cardiology

## 2016-09-16 NOTE — Telephone Encounter (Signed)
-----   Message from Quintella Reichertraci R Turner, MD sent at 09/15/2016  3:49 PM EDT ----- Patient has had a normal workup for syncope thus far.  Please refer him to EP (Dr. Graciela HusbandsKlein) for tilt table test.  For not patient cannot drive.

## 2016-09-16 NOTE — Telephone Encounter (Signed)
Late entry from 09/16/16 1415: Patient's wife's call put through to Nursing.  Explained to her that patients are always called back the same day so multiple calls are not necessary. Reiterated to her that information had to be retrieved from Medical Records and Dr. Mayford Knifeurner prior to calling back and that's why it hasn't been done yet. Explained to her that the patient will not be written out of work simply for wearing a monitor - that plenty of people wear monitors and their HR department does not say they need a note NOT to work at all.  Informed her the patient is NOT written out of work. He has been written not to drive given his syncopal event. Informed her the patient will be called to update and to offer to speak to HR department.

## 2016-09-16 NOTE — Telephone Encounter (Signed)
Patient called to request a call to Regina, an HR representative at his office. [facility] is [phone] ext: [phone]  While attempting to call Rene KocherRegina, Scheduling called to report the patient was calling again. The patient now agrees to Dr. Graciela HusbandsKlein referral. Message sent to EP scheduler to arrange appointment.  He understands he will be called to schedule.  Attempted to call Rene KocherRegina again to discuss heart monitor and that he will not be written out of work because he's wearing one.  Left message to call back.

## 2016-09-16 NOTE — Telephone Encounter (Signed)
New message    Pt is calling asking for a call back from [place]. He did not say what it was about, just that he was returning a call to her.

## 2016-09-16 NOTE — Telephone Encounter (Signed)
Patient is very upset because he has "been calling all day." Reiterated to the patient that paperwork had to be retrieved from Medical Records and reviewed prior to calling, and that he only has to call one time to receive a same-day call back. He states he needs a note for his HR department to be written out of work for the duration of his event monitor. Informed the patient that he will not be written out of work simply because he is wearing a monitor. The monitor should not be prohibitive or a danger to him while working. 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 Offered to call and speak with HR. The patient replied, "Maybe." Informed the patient he has not been written out of work, but he has been written not to drive for 6 months given his syncopal event. He became very angry and loud, yelling that it isn't right that he can work but NOT drive. He exclaimed, "This is stupid and I will drive anyway." He understands driving for the next few months is against medical advice. Reiterated to patient that cardiac workup has been normal so far, but he could be referred to Dr. Graciela HusbandsKlein for further testing. He refused referral and hung up the phone.   To Dr. Mayford Knifeurner as Lorain ChildesFYI.

## 2016-09-16 NOTE — Telephone Encounter (Signed)
F/U call: Patient states his phone is going dead, I did inform patient that you were in clinic this morning.

## 2016-09-16 NOTE — Telephone Encounter (Signed)
Follow up     Pt calling to talk to nurse to see if the paperwork for his job has been completed and he can return to work on 10-10-16

## 2016-09-16 NOTE — Telephone Encounter (Signed)
Left message to call back  

## 2016-09-16 NOTE — Telephone Encounter (Signed)
Patient calling states that HR at his job would like paperwork stating that it is okay for him to return to work on the 10-10-16 which is the last day that he has to wear heart monitor. Please call, thanks.

## 2016-09-16 NOTE — Telephone Encounter (Signed)
F/u message  Pt retuning RN call. Please call back to discuss

## 2016-09-16 NOTE — Telephone Encounter (Signed)
Spoke with patient he is aware [employer] paperwork is ready for pick up.

## 2016-09-17 ENCOUNTER — Ambulatory Visit: Payer: BLUE CROSS/BLUE SHIELD | Admitting: Family

## 2016-09-20 NOTE — Telephone Encounter (Signed)
Patient wife calling, states that she doesn't understand why patient cannot drive but can go to work. Patient wife states that her husband was not aware of appt with Dr. Ladona Ridgelaylor 09-28-16. Trevor Jones states that you may leave a detailed message. Please call to discuss, thanks.

## 2016-09-20 NOTE — Telephone Encounter (Signed)
Will forward message to Dr. Mayford Knifeurner for advisement.

## 2016-09-21 DIAGNOSIS — Q761 Klippel-Feil syndrome: Secondary | ICD-10-CM | POA: Diagnosis not present

## 2016-09-21 DIAGNOSIS — Z981 Arthrodesis status: Secondary | ICD-10-CM | POA: Diagnosis not present

## 2016-09-21 DIAGNOSIS — M542 Cervicalgia: Secondary | ICD-10-CM | POA: Diagnosis not present

## 2016-09-21 DIAGNOSIS — M79602 Pain in left arm: Secondary | ICD-10-CM | POA: Diagnosis not present

## 2016-09-21 NOTE — Telephone Encounter (Signed)
Follow up     Pt is calling to follow up on a letter for work.

## 2016-09-21 NOTE — Telephone Encounter (Signed)
Call back from patient and his wife.  They at first stated that I should call his HR representative, Rene KocherRegina to tell her what restrictions he has and what day he can return to work.  He has not been at work.  Her number is [phone] ext: [phone].  He asked to be kept off work until after his "test" on the 3rd of July, so to return on 7/8.  I advised him that the appointment is a consultation with an EP doctor to discuss the test, not to have an actual test.   We got disconnected and when he called back he told me to cancel the appointment on 7/3, he is not going to it, he said just call HR and tell them when I can go to work and if I have any restrictions.  I advised that I cannot make those decisions and that I need to review the chart for information from Dr. Mayford Knifeurner and at that point I will call her.   I spoke with Rene Kocheregina at patient's work.  She told me that she needs a release to let him come back to work with/without restrictions.   I advised that she I am unable to provide that type of document since Dr. Mayford Knifeurner is not here.  But that I see from the notes that patient was never put out of work by cardiology.  She stated she has a letter putting him off work until July 1.  She found the note in patient's file and realized this is from his PCP Jeanine LuzGregory Calone.  She will follow up with PCP office for any other necessary information.

## 2016-09-21 NOTE — Telephone Encounter (Signed)
Follow up    Pt is calling back to give wife's number. He is calling about a note for going back to work.

## 2016-09-21 NOTE — Telephone Encounter (Signed)
Called [phone].  No answer, unable to leave message. Called [phone] and left message that Im not sure what they need as far as why patient cannot drive but can return to work, this seemed to have already been addressed.  Advised that Dr. Mayford Knifeurner and her nurse are both out of the office this week but to call back if there is anything else we can do for them.

## 2016-09-22 ENCOUNTER — Encounter: Payer: Self-pay | Admitting: Family

## 2016-09-22 ENCOUNTER — Telehealth: Payer: Self-pay | Admitting: Cardiology

## 2016-09-22 ENCOUNTER — Telehealth: Payer: Self-pay | Admitting: Family

## 2016-09-22 NOTE — Telephone Encounter (Signed)
His cardiac tests are all normal thus far so only limitation is as stated below.  As stated earlier I did not give him his initial out of work note and this needs to be addressed with his PCP who took him out of work

## 2016-09-22 NOTE — Telephone Encounter (Signed)
He was seen by neurosurgery yesterday for an MRI. Please have him check on the results with them and have them forward any work restrictions. Right now he cannot operate heavy machinery, climb ladders or drive.

## 2016-09-22 NOTE — Telephone Encounter (Signed)
Wife made aware of Dr. Norris Crossurner's comments once again that his cardiac tests are all normal thus far and that he has been instructed not to drive for 6 months. He can work as long as it does not require the operation of heavy machiney or driving a car or truck or Therapist, musicclimbing ladders. Wife made aware once again that Dr. Mayford Knifeurner did not take the patient out of work and that these needs need to be addressed by his PCP. Wife verbalized understanding.

## 2016-09-22 NOTE — Telephone Encounter (Signed)
I told patient at prior OV that he needed to address his being out of work with his PCP who wrote the not to begin with.  All of his studies have been negative thus far and he needs to see EP.  Due to recurrent syncope he has been instructed not to drive for 6 months.  He can work as long as it does not require the operation of heavy machiney or driving a car or truck or Therapist, musicclimbing ladders.

## 2016-09-22 NOTE — Telephone Encounter (Signed)
Left a message for patient to call back. 

## 2016-09-22 NOTE — Telephone Encounter (Signed)
Left message for patient to call back  

## 2016-09-22 NOTE — Telephone Encounter (Signed)
Pt called stating he needs a note to return to work on 7/2, he does not believe he has any restrictions, he cannot drive so please call Marcelino DusterMichelle to come pick up when ready,  Please advise on restrictions and refer to phone note on 6/21 from [place].

## 2016-09-22 NOTE — Telephone Encounter (Signed)
Follow Up   Pt calling and states that disability paper that was provided for them does not match what they were told and they want someone to call them back as soon as possible.

## 2016-09-22 NOTE — Telephone Encounter (Signed)
Spoke with wife and MRI has not been scheduled. Pt was going to go back to work in the meantime until the MRI was done. I advised that Tammy SoursGreg would write a note with Dr. Malachy Moodurners restrictions pending MRI results. Wife is aware and would like a call when note is ready for pick up.

## 2016-09-22 NOTE — Telephone Encounter (Signed)
Patient calling states that he needs a paper stating that it is okay to go back to work.

## 2016-09-22 NOTE — Telephone Encounter (Addendum)
Spoke with patient's wife (DPR on file).  Wife made aware that Dr. Mayford Knifeurner told the patient at prior OV that he needed to address his being out of work with his PCP who wrote the note to begin with. Wife made aware that from a cardiology standpoint and due to his recurrent syncope he has been instructed not to drive for 6 months.  He can work as long as it does not require the operation of heavy machiney or driving a car or truck or Therapist, musicclimbing ladders. Wife states that at the patient's OV on 5/29 with Dr. Mayford Knifeurner she told them that he could not go back to work or lift or carry anything until after his testing. She states that Dr. Mayford Knifeurner did not list this on his disability paperwork and wants to know why. Patient's wife states that she had a coworker look in her husband's chart and told her that it was not done correctly.

## 2016-09-22 NOTE — Telephone Encounter (Signed)
Wife is aware letter is ready for pick up at the front desk.

## 2016-09-23 ENCOUNTER — Telehealth: Payer: Self-pay | Admitting: Cardiology

## 2016-09-23 NOTE — Telephone Encounter (Addendum)
Per Dr. Malachy Moodurners note the note that Tammy SoursGreg wrote was correct on the restrictions. I informed pt that there would be no change in the note. Pt tried to argue with me and ask me why would Dr. Mayford Knifeurner put those restrictions in the note to go back to work but not put those same restrictions on his disability forms. I advised to pt that I did not know the answer to that and he would have to call Dr. Mayford Knifeurner in regards. Pt then stated that Dr. Mayford Knifeurner and Tammy SoursGreg were trying to "screw" him out of his FMLA papers, disability form, and work note. He also stated that all everyone was doing was telling him and his wife lies about everything. I tried to explain to pt why Tammy SoursGreg was not willing to fill the FMLA paper work out the way he wanted and he stormed out of the office. Per Tammy SoursGreg due to inappropriate behavior pt is to be dismissed. Printing dismissal papers to give to [place].

## 2016-09-23 NOTE — Telephone Encounter (Signed)
Per the heart doctor his only restrictions are driving. He can operate heavy machinery and climb ladders. Can a new letter be written for him? He is here now.

## 2016-09-23 NOTE — Telephone Encounter (Signed)
No Alona BeneJoyce on DPR-called and spoke to wife (ok per DPR)-requesting patient come in office to sign release form in order for us to speak with Alona BeneJoyce in regards to work note.  Wife states patient cannot drive and they [place] in [place], therefore patient cannot come in to sign form.

## 2016-09-23 NOTE — Telephone Encounter (Signed)
New message   Pt employer verbalized that pt will need another note because of his restrictions

## 2016-09-23 NOTE — Telephone Encounter (Signed)
New Message ° ° pt wife verbalized that she is returning call for rn °

## 2016-09-23 NOTE — Telephone Encounter (Signed)
Attempt to call patient-left message (ok per DPR) to advise ok to come by office to get release form to take to patient to fill out or we can mail release form to patient to fill out.  Advised to call back to notify.

## 2016-09-23 NOTE — Telephone Encounter (Signed)
Patient called office to say he was sorry for storming out of the office earlier. He would call office later to make a follow up appointment. I informed I would pass on the information.

## 2016-09-24 ENCOUNTER — Telehealth: Payer: Self-pay | Admitting: Family

## 2016-09-24 NOTE — Telephone Encounter (Signed)
Follow Up   Trevor Jones from [employer] states that the form provided for the pt to return to work states there are restrictions and he cannot come back to work until there are no restrictions at all because the pt nature of work involves heavy lifting. She needs another note stating that he is out of work until he has no restrictions. She requests a call back.

## 2016-09-24 NOTE — Telephone Encounter (Signed)
Returned call to Alona BeneJoyce- left message advising we do not have permission to speak to her in regards to patient at this time.   Any questions or concerns will need to be discussed with patient.  Advised to call with questions or concerns.

## 2016-09-24 NOTE — Telephone Encounter (Signed)
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   States they cannot allow him to come back with ANY restrictions.  Requesting new note stating that patient can come back to work (full duty) on "xyz" date or not return until further evaluation.  Reports Primary advised her to contact Dr. Norris Crossurner's office.     Merri Brunettedvised Joyce that I am unable to discuss any information in regards to patient until permission has been granted from patient.    Alona BeneJoyce aware and verbalized understanding.    Alona BeneJoyce provided her secure fax # [phone] to have once permission has been granted.

## 2016-09-24 NOTE — Telephone Encounter (Signed)
F/u message  Pt returning RN call about letter for his job. Please call back to discuss

## 2016-09-24 NOTE — Telephone Encounter (Signed)
Attempt to return call-no answer, lmtbc on Monday.

## 2016-09-24 NOTE — Telephone Encounter (Signed)
Patient dismissed from [facility] [employer] by Dr. Carver Filaalone, effective 09/24/2016. Dismissal letter was sent by certified/registered mail. mwc

## 2016-09-24 NOTE — Telephone Encounter (Signed)
Received release form this Am -wife dropped off.  Form is incomplete (not listed who to release information to).  Spoke with medical records, can email or prepare another form for pick up.     Spoke to wife (ok per DPR)-requesting to email copy of correct form to have patient fill out again.   Medical records emailed form to wife to have patient fill out and return.

## 2016-09-27 ENCOUNTER — Telehealth: Payer: Self-pay | Admitting: Cardiology

## 2016-09-27 NOTE — Telephone Encounter (Signed)
Release of Information received by patient via fax. Release signed for us to send his disability paperwork to his Job at [employer].  I have called x2 this am left VM for HR Department to call me back with Fax number of where paperwork can be faxed to.

## 2016-09-28 ENCOUNTER — Telehealth: Payer: Self-pay | Admitting: Cardiology

## 2016-09-28 ENCOUNTER — Institutional Professional Consult (permissible substitution): Payer: BLUE CROSS/BLUE SHIELD | Admitting: Internal Medicine

## 2016-09-28 NOTE — Telephone Encounter (Signed)
Informed Trevor BeneJoyce, RN at [employer] that Dr. Mayford Knifeurner has filled out work according to her professional opinion and that the paperwork [employer] already received outlines her work recommendations.  She still requests a letter stating he has no restrictions while working. Reiterated that such letter cannot be written as per paperwork already received, Dr. Mayford Knifeurner has placed restrictions on operating heavy machinery, driving, and climbing ladders.  She states she will call PCP to try to get letter. She was grateful for assistance.

## 2016-09-28 NOTE — Telephone Encounter (Signed)
ROI received to be able to fax [employer] paperwork to patients place of employment. Received call back from Alona BeneJoyce this am with fax number to HR. I have faxed [employer] paperwork to [employer] attn: Alona BeneJoyce at [phone].

## 2016-09-28 NOTE — Telephone Encounter (Signed)
Follow up      Calling to get a return to work note for patient.  Patient has to be at 100% in order to return to work.  If there is a problem regarding getting a note, please call and let her know.  Please fax note to [phone] attn Alona BeneJoyce.

## 2016-09-28 NOTE — Telephone Encounter (Signed)
Regina from [employer] returned call. Reiterated to her what was told to VaditoJoyce. She was grateful for call.

## 2016-09-28 NOTE — Telephone Encounter (Signed)
Spoke with Medical Records. As far as paperwork, Dr. Mayford Knifeurner has already filled it out appropriately. It is up to the patient's job, [employer], to make decision about working.  Patient was supposed to have OV with Dr. Graciela HusbandsKlein today but the patient cancelled.  Called [employer] to reiterate that the paperwork is complete and filled appropriately by the doctor and the rest is up to them.  Left message to call back.

## 2016-09-28 NOTE — Telephone Encounter (Signed)
Follow Up :  Trevor Jones is calling to follow up on the return to work letter . He needs it to say that he has no restrictions so that he can go back to work .  Please call

## 2016-09-28 NOTE — Telephone Encounter (Signed)
New message     Pt needs a return to work form saying he has no restrictions,  Leave message with Francesca JewettRn Joyce if he does not answer.

## 2016-09-28 NOTE — Telephone Encounter (Signed)
Left patient VM for him to call me back so I can make him aware [employer] paperwork has been faxed to his place of employment.

## 2016-09-30 ENCOUNTER — Telehealth: Payer: Self-pay | Admitting: Family

## 2016-09-30 NOTE — Telephone Encounter (Signed)
Pt called stating that he received the letter saying that Tammy SoursGreg will no longer be able to see him due to his behavior. He is wanting to know what he should do regarding his work restrictions. He said that the heart doctor has released the pt to [place] and his restrictions. Being that these are for 3 months can he still see Tammy SoursGreg until he is released to resume is normal work or how would this work? He also said that he was sorry for the way that he acted and wanted to know if he was able to sit down and talk with Tammy SoursGreg and explain to him that he will be a better pt and not act the way he did, if Tammy SoursGreg could continue to see him. He explained to me that he has had a lot of anxiety and stress due to being out of work and not having an income. He also said that it was frustrating that the cardio office told his wife one thing but the letter he was given said something different and that is why he acted the way he did. He apologized and wished to continue to be a pt of Greg's if he would still see him.

## 2016-09-30 NOTE — Telephone Encounter (Signed)
Please inform patient that I am not inclined to write a note for him to go back to work without restrictions as cardiology has specified restrictions given his current status and concern for safety. If he were to go back to work without restrictions it would have to be at his own risk and against medical advice. He may wish to discuss further with cardiology regarding the restrictions. In regards to rescinding the dismissal, while I understand he is under stress and anxious about what is going on, the behavior that has been exhibited is inappropriate and has occurred on multiple occasions. Therefore we will continue to provide primary care service for the 30 days and I advise he seek a new provider during that time.

## 2016-09-30 NOTE — Telephone Encounter (Signed)
Pt called, please call back, states someone called him, but there are no notes in his chart. States he is supposed to get a letter stating he can go back to work with no restrictions

## 2016-10-01 NOTE — Telephone Encounter (Signed)
LVM for pt to call back.

## 2016-10-01 NOTE — Telephone Encounter (Signed)
Follow up      Pt request to talk to the nurse to get an update on his disability and restrictions.  He states that he is able to return to work but need the doctor to state that.  Please call and give update

## 2016-10-01 NOTE — Telephone Encounter (Signed)
Patient has been scheduled 7/10.

## 2016-10-01 NOTE — Telephone Encounter (Signed)
Informed patient his restrictions cannot be lifted as he reported he fainted. He became angry, exclaiming, "Well what am I supposed to do?" Reminded the patient that he cancelled his appointment with Dr. Graciela HusbandsKlein last week where tests could have been planned or he could have even potentially been cleared. He states he will blame the doctors if he is broke and dies in the street because he's lost everything.  He agrees to OV with Dr. Graciela HusbandsKlein for evaluation. EP scheduler will call to arrange.

## 2016-10-04 ENCOUNTER — Telehealth: Payer: Self-pay | Admitting: Cardiology

## 2016-10-04 NOTE — Telephone Encounter (Signed)
Spoke with pt last week and let him know Greg's response below. Let him know that Tammy SoursGreg would not be writing him a note to go back to work with no restrictions due to the safety issues and those restrictions being done by cardiology. I made it clear that if he tries to go to work with no restrictions then it would be at his own risk. He stated that the cardiologist said he had no restrictions when he was trying to get disability and now when he is trying to go back to work there are restrictions. I advised that if he felt the need to discuss restrictions to follow up with cardiology. Pt kept stating that we did not care that he has been out of work for 7 weeks with no money coming it and that he will lose everything and will be living on the street. I let pt know that we were not trying to punish him we were looking out for his safety and the safety of others when it comes to him having syncope episodes. Pt was very upset when getting off the phone. The only thing I could advise him is to follow up with cardiology.

## 2016-10-04 NOTE — Telephone Encounter (Signed)
Trevor HasEric Devlin w/ [employer] callled to see if we have received a request for records on this patient. I made representative from [facility] aware I see no request. I gave Minerva Areolaric my fax number and he stated he will forward a request over.

## 2016-10-05 ENCOUNTER — Encounter: Payer: Self-pay | Admitting: Internal Medicine

## 2016-10-05 ENCOUNTER — Ambulatory Visit (INDEPENDENT_AMBULATORY_CARE_PROVIDER_SITE_OTHER): Payer: BLUE CROSS/BLUE SHIELD | Admitting: Internal Medicine

## 2016-10-05 ENCOUNTER — Ambulatory Visit: Payer: BLUE CROSS/BLUE SHIELD | Admitting: Family

## 2016-10-05 VITALS — BP 149/95 | HR 85 | Ht 65.0 in | Wt 213.0 lb

## 2016-10-05 DIAGNOSIS — R55 Syncope and collapse: Secondary | ICD-10-CM | POA: Diagnosis not present

## 2016-10-05 NOTE — Progress Notes (Signed)
ELECTROPHYSIOLOGY CONSULT NOTE  Patient ID: Trevor Jones, MRN: [id number], DOB/AGE: [birth year] 51 y.o. Admit date: (Not on file) Date of Consult: 10/05/2016  Primary Physician: Trevor Speak, FNP Primary Cardiologist: Trevor Jones is being seen today for the evaluation of spells  at the request of  Trevor Speak, FNP.and Dr Trevor   HPI Trevor Jones is a 51 y.o. male  With a 5 year hx of spells which have occurred about 5/yr without significant change in frequency or intensity  They are characterized by pain on the left side of his head, neck andthen back   Sometimes his legs get locked--describing not being able to move them or walk.  This is followed by a gentle swirling dizziness asso with weakness and then either resolves rapidly without residua or progresses so that he loses consciousness.  He has never fallen  He in noted not to become pale  He can sit and stay seated without support and then the episodes resolve, maybe minutes  Again without residua  Eval has inclu DATE TEST    6/18    ECHO   EF 60-65 % MILD LVH  6/18    myoview   EF 55 % NO ISCHEMIA         Efforts about wearing a monitor created a significant series of phone notes- reviwewed and then discussed with patients  About the same time 5 years ago, he developed unilateral left sided hearing loss, maybe tinnitus      Past Medical History:  Diagnosis Date  . Borderline blood pressure 08/19/2016  . Cervical pain 12/16/2014  . Decreased hearing of left ear   . Hypertension   . Left-sided low back pain with left-sided sciatica 12/16/2014  . Neck pain   . Otitis externa of left ear 08/13/2016  . Syncope 12/16/2014      Surgical History:  Past Surgical History:  Procedure Laterality Date  . CERVICAL SPINE SURGERY       Home Meds: Prior to Admission medications   Medication Sig Start Date End Date Taking? Authorizing Provider  cyclobenzaprine (FLEXERIL) 5 MG tablet  11/27/14  Yes  [provider]  diclofenac (VOLTAREN) 75 MG EC tablet  11/27/14  Yes [provider]  NEOMYCIN-POLYMYXIN-HYDROCORTISONE (CORTISPORIN) 1 % SOLN otic solution Place 3 drops into the left ear 4 (four) times daily. 08/19/16  Yes Trevor Speak, FNP    Allergies: No Known Allergies  Social History   Social History  . Marital status: Married    Spouse name: N/A  . Number of children: 0  . Years of education: 14   Occupational History  . Scientist, physiological     Social History Main Topics  . Smoking status: Never Smoker  . Smokeless tobacco: Never Used  . Alcohol use No  . Drug use: No  . Sexual activity: Not on file   Other Topics Concern  . Not on file   Social History Narrative   Denies religious beliefs effecting health care.      Family History  Problem Relation Age of Onset  . Cancer Mother        intestinal  . Cancer Father        unknown type     ROS:  Please see the history of present illness.     All other systems reviewed and negative.    Physical Exam: Blood pressure (!) 149/95, pulse 85, height 5\' 5"  (1.651 m), weight  213 lb (96.6 kg), SpO2 98 %. General: Well developed, well nourished male in no acute distresssomewhat dysmorphic facies . Head: Normocephalic, atraumatic, sclera non-icteric, no xanthomas, nares are without discharge. EENT: normal  Lymph Nodes:  none Neck: Negative for carotid bruits. JVD not elevated. Back:without scoliosis kyphosis Lungs: Clear bilaterally to auscultation without wheezes, rales, or rhonchi. Breathing is unlabored. Heart: RRR with S1 S2. No  murmur . No rubs, or gallops appreciated. Abdomen: Soft, non-tender, non-distended with normoactive bowel sounds. No hepatomegaly. No rebound/guarding. No obvious abdominal masses. Msk:  Strength and tone appear normal for age. Extremities: No clubbing or cyanosis. No edema.  Distal pedal pulses are 2+ and equal bilaterally. Skin: Warm and Dry Neuro: Alert and  oriented X 3. CN III-XII intact Grossly normal sensory and motor function .B horizontal nystamus  Psych:  Responds to questions appropriately with a normal affect.      Labs: Cardiac Enzymes No results for input(s): CKTOTAL, CKMB, TROPONINI in the last 72 hours. CBC Lab Results  Component Value Date   WBC 6.3 12/03/2014   HGB 15.3 12/03/2014   HCT 44.7 12/03/2014   MCV 96.3 12/03/2014   PLT 130 (L) 12/03/2014   PROTIME: No results for input(s): LABPROT, INR in the last 72 hours. Chemistry No results for input(s): NA, K, CL, CO2, BUN, CREATININE, CALCIUM, PROT, BILITOT, ALKPHOS, ALT, AST, GLUCOSE in the last 168 hours.  Invalid input(s): LABALBU Lipids No results found for: CHOL, HDL, LDLCALC, TRIG BNP No results found for: PROBNP Thyroid Function Tests: No results for input(s): TSH, T4TOTAL, T3FREE, THYROIDAB in the last 72 hours.  Invalid input(s): FREET3 Miscellaneous No results found for: DDIMER  Radiology/Studies:  No results found.  EKG: sinus 74  14/07/37    Assessment and Plan:  Spells/LOC  Hearing loss  Pt spells and LOC are unusual.   They are unassociated with pallor or total loss of tone. Hence I dont think they are cardiovascular in origin   I dont think tilt table testing would be useful, although if there is a population in which it might be, it is a group of folks with atypical syncope.  However, he has not been syncopal as best as I can tell  The onset with L head ache and the coincidental onset of L earing loss with eviidence of nystagmus raises concern about otoneurological condition-- and have suggested he be seen by ENT or Neuro or combination  If that work up is negative, monitoring, which has caused great rancor, coould be done, but the frequency of events is such that I think an implantable monitor would be the most efficient tool   He was given a letter saying I did not think his spells had a cardiovascular origin    Trevor Jones

## 2016-10-05 NOTE — Patient Instructions (Signed)
Medication Instructions: - Your physician recommends that you continue on your current medications as directed. Please refer to the Current Medication list given to you today.  Labwork: - none ordered  Procedures/Testing: - none ordered  Follow-Up: - Dr. Klein will see you back on an as needed basis.  Any Additional Special Instructions Will Be Listed Below (If Applicable).     If you need a refill on your cardiac medications before your next appointment, please call your pharmacy.   

## 2016-10-05 NOTE — Telephone Encounter (Signed)
Patient received and signed the certified notice received on 09/30/2016 article number [id number]. mwc

## 2016-10-06 ENCOUNTER — Telehealth: Payer: Self-pay | Admitting: Internal Medicine

## 2016-10-06 NOTE — Telephone Encounter (Signed)
F/U message:  Patient calling in regards to letter, states that he needs the date on the letter. Patient would like to start back tomorrow. I did inform patient that Herbert SetaHeather is out of office.

## 2016-10-06 NOTE — Telephone Encounter (Signed)
Returned call to patient to discuss exactly what his job responsibility is at work.   He's not driving machinery.  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  Each window weighs 20-100 pounds and he usually does 100-150 windows per day.  He says he has done the same job for 17 years.  I let him know I would discuss with Dr Graciela HusbandsKlein and call him first thing in the morning as he has someone that will take him to work tomorrow if note is sent in to job.

## 2016-10-06 NOTE — Telephone Encounter (Signed)
This patient is requesting a note to return to work tomorrow with no restrictions. He works in a factory, Water quality scientistoperating heavy machinery. His PCP originally took him out of work. Dr. Mayford Knifeurner previously informed him he may work (with restrictions to not operate heavy machinery or climb ladders) but that he could not drive.  Cardiology did not take him out of work.  The patient saw Dr. Graciela HusbandsKlein yesterday and note is available. Per Dr. Odessa FlemingKlein's nurses note below, he will not clear him to return to work since someone else took him out of work.  The patient has been told several times that Cardiology did not take him out of work, but since he fainted he will have restrictions for 6 months.  He is requesting, again, a note from Dr. Mayford Knifeurner stating he may start work again tomorrow with no restrictions at all.

## 2016-10-06 NOTE — Telephone Encounter (Signed)
F/U Call:  Mr. Trevor Jones calling back for update, I did inform patient that you were awaiting Dr. Odessa FlemingKlein's review (per note.)

## 2016-10-06 NOTE — Telephone Encounter (Signed)
F/u message  Pt call to f/u on his date to return back to work. Please call back to discuss

## 2016-10-06 NOTE — Telephone Encounter (Signed)
New message     Pt needs a return to work note, with a specific date on it.  Or his job will not let him return  Fax letter to Senaida OresJoyce Proctor fax [phone]  ([employer])

## 2016-10-06 NOTE — Telephone Encounter (Signed)
Becky Bishop called w/ [employer] and was asking for patients RTW dates and was checking on the status of patients medical records. I told her I could transfer her call to [facility] for the update on medical records and she stated " no their no help" and as far as return to work dates I made her aware I cannot release that information to her she hung up the phone on me.

## 2016-10-06 NOTE — Telephone Encounter (Signed)
Spoke with Dr. Graciela HusbandsKlein and he will call the patient as he states he cannot clear him to return to work- he does not have an explanation for his "spells" and can't give him an ok to return if someone else has taken him out of work.

## 2016-10-06 NOTE — Telephone Encounter (Signed)
To Dr. Klein to review. 

## 2016-10-06 NOTE — Telephone Encounter (Signed)
434pm pt called back to make sure RTW note will be faxed tomorrow, ZOX:WRUEAatt:Joyce Valorie RooseveltProctor [phone]. 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 No driving or climbing ladders is involved. Per pt Dr. Aretha Parrotologne took him out of work. Pt requesting call when faxed and to let him know what date to return to work.

## 2016-10-07 ENCOUNTER — Encounter: Payer: Self-pay | Admitting: *Deleted

## 2016-10-07 NOTE — Telephone Encounter (Signed)
A letter has been placed in patient's chart for him to return to work.  I was in the process of completing and waiting for Dr Odessa FlemingKlein's approval [facility] and [facility] out.  When I logged back in to complete the letter had been signed.  I needed to make correction and it will not let me therefore another letter has been created and sent to his employer.  He may return to work from a cardiovascular standpoint.  His only restriction is no driving for 6 months from date of his syncopal episode.  He may need to have additional follow up and or clearance from other doctors if further testing is needed to determine his cause of syncope.  His cardiovascular workup was negative.

## 2016-10-07 NOTE — Telephone Encounter (Signed)
Spoke with patient and let him know I had spoken with Dr Graciela HusbandsKlein last night.  Dr Graciela HusbandsKlein wrote him a letter on 10/05/16 and I have left his employee nurse a message to call me to discuss further.

## 2016-10-07 NOTE — Telephone Encounter (Signed)
F/U Call:  Patient calling and asks for [facility]. Thanks.

## 2016-10-07 NOTE — Telephone Encounter (Signed)
Follow Up   Pt returning a call to [facility] this morning at 9:05AM. Pt states he specifically wants to speak back to [facility]

## 2016-10-12 ENCOUNTER — Ambulatory Visit: Payer: BLUE CROSS/BLUE SHIELD | Admitting: Family

## 2016-10-15 ENCOUNTER — Ambulatory Visit (INDEPENDENT_AMBULATORY_CARE_PROVIDER_SITE_OTHER): Payer: BLUE CROSS/BLUE SHIELD | Admitting: Family

## 2016-10-15 ENCOUNTER — Encounter: Payer: Self-pay | Admitting: Family

## 2016-10-15 VITALS — BP 126/84 | HR 74 | Temp 98.6°F | Resp 16 | Ht 65.0 in | Wt 219.0 lb

## 2016-10-15 DIAGNOSIS — R030 Elevated blood-pressure reading, without diagnosis of hypertension: Secondary | ICD-10-CM | POA: Insufficient documentation

## 2016-10-15 MED ORDER — NEOMYCIN-POLYMYXIN-HC 1 % OT SOLN: 3.0000 [drp] | Freq: Four times a day (QID) | OTIC | 0 refills | Status: DC

## 2016-10-15 NOTE — Patient Instructions (Signed)
Thank you for choosing [facility].  SUMMARY AND INSTRUCTIONS:  Please continue to take your medications as prescribed.  Monitor your blood pressure at home.   Follow a low sodium intake of less than 2.5 grams of sodium.  Medication:  Your prescription(s) have been submitted to your pharmacy or been printed and provided for you. Please take as directed and contact our office if you believe you are having problem(s) with the medication(s) or have any questions.  Follow up:  If your symptoms worsen or fail to improve, please contact our office for further instruction, or in case of emergency go directly to the emergency room at the closest medical facility.

## 2016-10-15 NOTE — Assessment & Plan Note (Signed)
Elevated blood pressure likely situational as blood pressure is normal today with no significant findings on exam. Recommend following a low sodium diet and continue to monitor blood pressure at home. Follow up as needed.

## 2016-10-15 NOTE — Progress Notes (Signed)
Subjective:    Patient ID: Trevor Jones, male    DOB: 10-Feb-1966, 51 y.o.   MRN: [id number]  Chief Complaint  Patient presents with  . Hypertension    blood pressure was elevated last night    HPI:  Trevor ShyMichael Gillihan is a 51 y.o. male who  has a past medical history of Borderline blood pressure (08/19/2016); Cervical pain (12/16/2014); Decreased hearing of left ear; Hypertension; Left-sided low back pain with left-sided sciatica (12/16/2014); Neck pain; Otitis externa of left ear (08/13/2016); and Syncope (12/16/2014). and presents today for a follow up office visit.   This is a new problem. Associated symptom of elevated blood pressure noted when he was at church last evening. Indicates that he has been exercising more and trying to eat right. Denies worst headache of life or other symptoms of end organ damage. Recently returned to work following syncopal episodes.  BP Readings from Last 3 Encounters:  10/15/16 126/84  10/05/16 (!) 149/95  08/24/16 132/70    No Known Allergies    Outpatient Medications Prior to Visit  Medication Sig Dispense Refill  . cyclobenzaprine (FLEXERIL) 5 MG tablet   0  . diclofenac (VOLTAREN) 75 MG EC tablet   0  . NEOMYCIN-POLYMYXIN-HYDROCORTISONE (CORTISPORIN) 1 % SOLN otic solution Place 3 drops into the left ear 4 (four) times daily. 10 mL 0   No facility-administered medications prior to visit.      Review of Systems  Constitutional: Negative for chills and fever.  Eyes:       Negative for changes in vision  Respiratory: Negative for cough, chest tightness and wheezing.   Cardiovascular: Negative for chest pain, palpitations and leg swelling.  Neurological: Negative for dizziness, weakness and light-headedness.      Objective:    BP 126/84 (BP Location: Left Arm, Patient Position: Sitting, Cuff Size: Large)   Pulse 74   Temp 98.6 F (37 C) (Oral)   Resp 16   Ht 5\' 5"  (1.651 m)   Wt 219 lb (99.3 kg)   SpO2 96%   BMI 36.44 kg/m    Nursing note and vital signs reviewed.  Physical Exam  Constitutional: He is oriented to person, place, and time. He appears well-developed and well-nourished. No distress.  Eyes: Pupils are equal, round, and reactive to light. Conjunctivae and EOM are normal.  Neck: Neck supple.  Cardiovascular: Normal rate, regular rhythm, normal heart sounds and intact distal pulses.  Exam reveals no gallop and no friction rub.   No murmur heard. Pulmonary/Chest: Effort normal and breath sounds normal. No respiratory distress. He has no wheezes. He has no rales. He exhibits no tenderness.  Neurological: He is alert and oriented to person, place, and time. No cranial nerve deficit.  Skin: Skin is warm and dry.  Psychiatric: He has a normal mood and affect. His behavior is normal. Judgment and thought content normal.       Assessment & Plan:   Problem List Items Addressed This Visit      Other   Elevated blood pressure reading - Primary    Elevated blood pressure likely situational as blood pressure is normal today with no significant findings on exam. Recommend following a low sodium diet and continue to monitor blood pressure at home. Follow up as needed.           I am having [name] maintain his cyclobenzaprine, diclofenac, and NEOMYCIN-POLYMYXIN-HYDROCORTISONE.   Meds ordered this encounter  Medications  . NEOMYCIN-POLYMYXIN-HYDROCORTISONE (CORTISPORIN) 1 % SOLN  OTIC solution    Sig: Place 3 drops into the left ear 4 (four) times daily.    Dispense:  10 mL    Refill:  0    Order Specific Question:   Supervising Provider    Answer:   Hillard Danker A [[id number]]     Follow-up: Return if symptoms worsen or fail to improve.  Jeanine Luz, FNP

## 2016-10-19 ENCOUNTER — Telehealth: Payer: Self-pay | Admitting: Cardiology

## 2016-10-19 NOTE — Telephone Encounter (Signed)
-----   Message from Quintella Reichertraci R Turner, MD sent at 10/18/2016  8:15 PM EDT ----- Heart monitor was normal

## 2016-10-19 NOTE — Telephone Encounter (Signed)
Informed patient of results and verbal understanding expressed.  

## 2016-10-19 NOTE — Telephone Encounter (Signed)
Mrs.Bills is returning a call . Please call

## 2017-09-19 ENCOUNTER — Emergency Department
Admission: EM | Admit: 2017-09-19 | Discharge: 2017-09-19 | Discharge status: Absent without leave | Payer: Self-pay | Source: Home / Self Care

## 2017-09-20 DIAGNOSIS — S46912A Strain of unspecified muscle, fascia and tendon at shoulder and upper arm level, left arm, initial encounter: Secondary | ICD-10-CM | POA: Diagnosis not present

## 2017-09-20 DIAGNOSIS — S40022A Contusion of left upper arm, initial encounter: Secondary | ICD-10-CM | POA: Diagnosis not present

## 2017-10-10 ENCOUNTER — Emergency Department
Admission: EM | Admit: 2017-10-10 | Discharge: 2017-10-10 | Disposition: A | Discharge status: Home / Self Care | Payer: BLUE CROSS/BLUE SHIELD | Source: Home / Self Care | Attending: Family Medicine | Admitting: Family Medicine

## 2017-10-10 ENCOUNTER — Other Ambulatory Visit: Payer: Self-pay

## 2017-10-10 ENCOUNTER — Encounter: Payer: Self-pay | Admitting: *Deleted

## 2017-10-10 DIAGNOSIS — M5432 Sciatica, left side: Secondary | ICD-10-CM

## 2017-10-10 DIAGNOSIS — M5412 Radiculopathy, cervical region: Secondary | ICD-10-CM

## 2017-10-10 MED ORDER — PREDNISONE 20 MG PO TABS: ORAL_TABLET | ORAL | 0 refills | Status: AC

## 2017-10-10 NOTE — Discharge Instructions (Addendum)
Apply ice pack for 20 to 30 minutes, 3 to 4 times daily  Continue until pain and swelling decrease.  °

## 2017-10-10 NOTE — ED Triage Notes (Signed)
Pt reports intermittent episodes of sharp neck pain followed by an episode of leg cramps and leg weakness. He also reports some LT arm pain and weakness intermittently. Hx of cervical fusion surgery.

## 2017-10-10 NOTE — ED Provider Notes (Signed)
[facility]    CSN: [id number] Arrival date & time: 10/10/17  1443     History   Chief Complaint Chief Complaint  Patient presents with  . Neck Pain  . Extremity Weakness    HPI Trevor Jones is a 52 y.o. male.   During the past 2 days patient has developed sharp positional pain in his left neck, and occasional left arm pain and weakness.  He has a past history of cervical fusion.   He has also developed intermittent left back ache radiating to his left leg.   He denies bowel or bladder dysfunction, and no saddle numbness.  He recalls no recent injury or change in activities.  The history is provided by the patient.  Back Pain  Location:  Lumbar spine Quality:  Aching Radiates to:  L posterior upper leg Pain severity:  Mild Pain is:  Same all the time Onset quality:  Gradual Duration:  2 days Timing:  Intermittent Progression:  Unchanged Chronicity:  New Context: not recent injury   Relieved by:  Being still Worsened by:  Ambulation and movement Ineffective treatments:  None tried Associated symptoms: paresthesias   Associated symptoms: no bladder incontinence, no bowel incontinence, no dysuria, no fever, no leg pain, no numbness, no pelvic pain, no perianal numbness, no tingling, no weakness and no weight loss   Risk factors: obesity     Past Medical History:  Diagnosis Date  . Borderline blood pressure 08/19/2016  . Cervical pain 12/16/2014  . Decreased hearing of left ear   . Hypertension   . Left-sided low back pain with left-sided sciatica 12/16/2014  . Neck pain   . Otitis externa of left ear 08/13/2016  . Syncope 12/16/2014    Patient Active Problem List   Diagnosis Date Noted  . Elevated blood pressure reading 10/15/2016  . Chest pain 08/24/2016  . Borderline blood pressure 08/19/2016  . Otitis externa of left ear 08/13/2016  . Cervical pain 12/16/2014  . Left-sided low back pain with left-sided sciatica 12/16/2014  . Syncope 12/16/2014     Past Surgical History:  Procedure Laterality Date  . CERVICAL SPINE SURGERY         Home Medications    Prior to Admission medications   Medication Sig Start Date End Date Taking? Authorizing Provider  Multiple Vitamin (MULTIVITAMIN) tablet Take 1 tablet by mouth daily.   Yes [provider]  predniSONE (DELTASONE) 20 MG tablet Take one tab by mouth twice daily for 4 days, then one daily. Take with food. 10/10/17   Lattie Haw, MD    Family History Family History  Problem Relation Age of Onset  . Cancer Mother        intestinal  . Cancer Father        unknown type    Social History Social History   Tobacco Use  . Smoking status: Never Smoker  . Smokeless tobacco: Never Used  Substance Use Topics  . Alcohol use: No  . Drug use: No     Allergies   Patient has no known allergies.   Review of Systems Review of Systems  Constitutional: Negative for chills, diaphoresis, fatigue, fever and weight loss.  Gastrointestinal: Negative for bowel incontinence.  Genitourinary: Negative for bladder incontinence, dysuria and pelvic pain.  Musculoskeletal: Positive for back pain, neck pain and neck stiffness.  Skin: Negative.   Neurological: Positive for paresthesias. Negative for tingling, weakness and numbness.  All other systems reviewed and are negative.  Physical Exam Triage Vital Signs ED Triage Vitals  Enc Vitals Group     BP 10/10/17 1516 (!) 146/87     Pulse Rate 10/10/17 1516 66     Resp 10/10/17 1516 18     Temp 10/10/17 1516 98.3 F (36.8 C)     Temp Source 10/10/17 1516 Oral     SpO2 10/10/17 1516 99 %     Weight 10/10/17 1517 207 lb (93.9 kg)     Height 10/10/17 1517 5\' 4"  (1.626 m)     Head Circumference --      Peak Flow --      Pain Score 10/10/17 1517 0     Pain Loc --      Pain Edu? --      Excl. in GC? --    No data found.  Updated Vital Signs BP (!) 146/87 (BP Location: Right Arm)   Pulse 66   Temp 98.3 F (36.8 C)  (Oral)   Resp 18   Ht 5\' 4"  (1.626 m)   Wt 207 lb (93.9 kg)   SpO2 99%   BMI 35.53 kg/m   Visual Acuity Right Eye Distance:   Left Eye Distance:   Bilateral Distance:    Right Eye Near:   Left Eye Near:    Bilateral Near:     Physical Exam  Constitutional: He appears well-developed and well-nourished. No distress.  HENT:  Head: Normocephalic.  Right Ear: External ear normal.  Left Ear: External ear normal.  Nose: Nose normal.  Mouth/Throat: Oropharynx is clear and moist.  Eyes: Pupils are equal, round, and reactive to light. Conjunctivae are normal.  Neck: Normal range of motion.    Vague tenderness to palpation left trapezius area as noted on diagram.   Cardiovascular: Normal heart sounds.  Pulmonary/Chest: Breath sounds normal.  Abdominal: There is no tenderness.  Musculoskeletal:       Back:  Back:  Range of motion relatively well preserved.  Can heel/toe walk and squat without difficulty.    Tenderness in the midline and left paraspinous muscles from L3 to Sacral area.  Straight leg raising test is negative.  Sitting knee extension test is negative.  Strength and sensation in the lower extremities is normal.  Patellar and achilles reflexes are normal   Lymphadenopathy:    He has no cervical adenopathy.  Neurological: He is alert.  Skin: Skin is warm and dry. No rash noted.  Nursing note and vitals reviewed.    UC Treatments / Results  Labs (all labs ordered are listed, but only abnormal results are displayed) Labs Reviewed - No data to display  EKG None  Radiology No results found.  Procedures Procedures (including critical care time)  Medications Ordered in UC Medications - No data to display  Initial Impression / Assessment and Plan / UC Course  I have reviewed the triage vital signs and the nursing notes.  Pertinent labs & imaging results that were available during my care of the patient were reviewed by me and considered in my medical decision  making (see chart for details).    Begin prednisone burst/taper. Followup with Dr. Rodney Langtonhomas Thekkekandam or Dr. Clementeen GrahamEvan Corey ([facility]) if not improving about one week.   Final Clinical Impressions(s) / UC Diagnoses   Final diagnoses:  Cervical radiculopathy  Sciatica of left side     Discharge Instructions     Apply ice pack for 20 to 30 minutes, 3 to 4 times daily  Continue  until pain and swelling decrease.     ED Prescriptions    Medication Sig Dispense Auth. Provider   predniSONE (DELTASONE) 20 MG tablet Take one tab by mouth twice daily for 4 days, then one daily. Take with food. 12 tablet Lattie Haw, MD         Lattie Haw, MD 10/14/17 [phone]

## 2017-10-19 ENCOUNTER — Ambulatory Visit: Payer: BLUE CROSS/BLUE SHIELD | Admitting: Physician Assistant

## 2017-11-11 DIAGNOSIS — Z8673 Personal history of transient ischemic attack (TIA), and cerebral infarction without residual deficits: Secondary | ICD-10-CM | POA: Diagnosis not present

## 2017-11-11 DIAGNOSIS — M19071 Primary osteoarthritis, right ankle and foot: Secondary | ICD-10-CM | POA: Diagnosis not present

## 2017-11-11 DIAGNOSIS — M79661 Pain in right lower leg: Secondary | ICD-10-CM | POA: Diagnosis not present

## 2017-11-11 DIAGNOSIS — S91201A Unspecified open wound of right great toe with damage to nail, initial encounter: Secondary | ICD-10-CM | POA: Diagnosis not present

## 2017-11-11 DIAGNOSIS — M79671 Pain in right foot: Secondary | ICD-10-CM | POA: Diagnosis not present

## 2017-11-11 DIAGNOSIS — S91211A Laceration without foreign body of right great toe with damage to nail, initial encounter: Secondary | ICD-10-CM | POA: Diagnosis not present

## 2017-11-11 DIAGNOSIS — W1849XA Other slipping, tripping and stumbling without falling, initial encounter: Secondary | ICD-10-CM | POA: Diagnosis not present

## 2017-11-11 DIAGNOSIS — G8911 Acute pain due to trauma: Secondary | ICD-10-CM | POA: Diagnosis not present

## 2017-11-14 DIAGNOSIS — R55 Syncope and collapse: Secondary | ICD-10-CM | POA: Diagnosis not present

## 2017-11-14 DIAGNOSIS — Z981 Arthrodesis status: Secondary | ICD-10-CM | POA: Diagnosis not present

## 2017-11-14 DIAGNOSIS — Z8673 Personal history of transient ischemic attack (TIA), and cerebral infarction without residual deficits: Secondary | ICD-10-CM | POA: Diagnosis not present

## 2017-11-14 DIAGNOSIS — R5383 Other fatigue: Secondary | ICD-10-CM | POA: Diagnosis not present

## 2017-11-14 DIAGNOSIS — Z7982 Long term (current) use of aspirin: Secondary | ICD-10-CM | POA: Diagnosis not present

## 2017-11-18 DIAGNOSIS — W19XXXA Unspecified fall, initial encounter: Secondary | ICD-10-CM | POA: Diagnosis not present

## 2017-11-18 DIAGNOSIS — S40812A Abrasion of left upper arm, initial encounter: Secondary | ICD-10-CM | POA: Diagnosis not present

## 2017-11-18 DIAGNOSIS — R252 Cramp and spasm: Secondary | ICD-10-CM | POA: Diagnosis not present

## 2017-11-29 DIAGNOSIS — R0602 Shortness of breath: Secondary | ICD-10-CM | POA: Diagnosis not present

## 2017-11-29 DIAGNOSIS — R252 Cramp and spasm: Secondary | ICD-10-CM | POA: Diagnosis not present

## 2017-11-29 DIAGNOSIS — R42 Dizziness and giddiness: Secondary | ICD-10-CM | POA: Diagnosis not present

## 2017-11-29 DIAGNOSIS — M4802 Spinal stenosis, cervical region: Secondary | ICD-10-CM | POA: Diagnosis not present

## 2017-11-29 DIAGNOSIS — W19XXXA Unspecified fall, initial encounter: Secondary | ICD-10-CM | POA: Diagnosis not present

## 2017-12-16 DIAGNOSIS — D696 Thrombocytopenia, unspecified: Secondary | ICD-10-CM | POA: Diagnosis not present

## 2017-12-16 DIAGNOSIS — R42 Dizziness and giddiness: Secondary | ICD-10-CM | POA: Diagnosis not present

## 2017-12-16 DIAGNOSIS — R55 Syncope and collapse: Secondary | ICD-10-CM | POA: Diagnosis not present

## 2017-12-16 DIAGNOSIS — R7301 Impaired fasting glucose: Secondary | ICD-10-CM | POA: Diagnosis not present

## 2017-12-23 DIAGNOSIS — D696 Thrombocytopenia, unspecified: Secondary | ICD-10-CM | POA: Diagnosis not present

## 2018-01-02 DIAGNOSIS — H66001 Acute suppurative otitis media without spontaneous rupture of ear drum, right ear: Secondary | ICD-10-CM | POA: Diagnosis not present

## 2018-01-02 DIAGNOSIS — R42 Dizziness and giddiness: Secondary | ICD-10-CM | POA: Diagnosis not present

## 2018-01-29 IMAGING — NM NM MISC PROCEDURE
6 series · 36 of 36 positions shown · non-contrast
Comparison: none

[Series 1: wbr_r-proj_st rest · 6.51mm/px · 6 of 64 frames shown]
[frame 6/64]
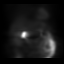
[frame 16/64]
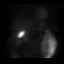
[frame 27/64]
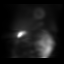
[frame 38/64]
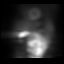
[frame 48/64]
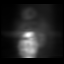
[frame 59/64]
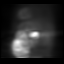

[Series 1: rest · 6.51mm/px · 6 of 64 frames shown]
[frame 6/64]
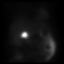
[frame 16/64]
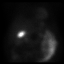
[frame 27/64]
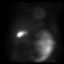
[frame 38/64]
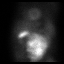
[frame 48/64]
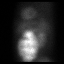
[frame 59/64]
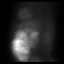

[Series 2: wbr_s-proj_st stress · 6.51mm/px · 6 of 64 frames shown (1 of 2)]
[frame 6/64]
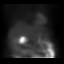
[frame 16/64]
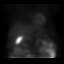
[frame 27/64]
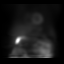
[frame 38/64]
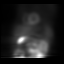
[frame 48/64]
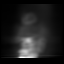
[frame 59/64]
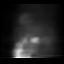

[Series 2: stress · 6.51mm/px · 6 of 512 frames shown (1 of 2)]
[frame 43/512]
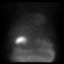
[frame 128/512]
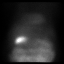
[frame 214/512]
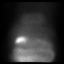
[frame 299/512]
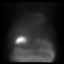
[frame 384/512]
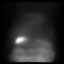
[frame 470/512]
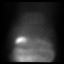

[Series 2: wbr_s-proj_st stress · 6.51mm/px · 6 of 512 frames shown (2 of 2)]
[frame 43/512]
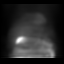
[frame 128/512]
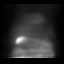
[frame 214/512]
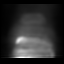
[frame 299/512]
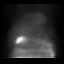
[frame 384/512]
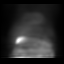
[frame 470/512]
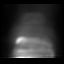

[Series 2: stress · 6.51mm/px · 6 of 64 frames shown (2 of 2)]
[frame 6/64]
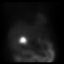
[frame 16/64]
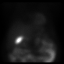
[frame 27/64]
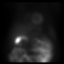
[frame 38/64]
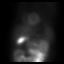
[frame 48/64]
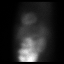
[frame 59/64]
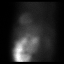

[36 of 36 positions shown; findings below may reference images not displayed]

Canned report from images found in remote index.

Refer to host system for actual result text.
